# Patient Record
Sex: Male | Born: 1995 | Race: Black or African American | Hispanic: No | Marital: Single | Smoking: Former smoker
Health system: Southern US, Community
[De-identification: ages and names within clinical notes are randomized; demographics above are authoritative.]

## PROBLEM LIST (undated history)

## (undated) DIAGNOSIS — B1910 Unspecified viral hepatitis B without hepatic coma: Secondary | ICD-10-CM

## (undated) DIAGNOSIS — B20 Human immunodeficiency virus [HIV] disease: Secondary | ICD-10-CM

## (undated) DIAGNOSIS — Z21 Asymptomatic human immunodeficiency virus [HIV] infection status: Secondary | ICD-10-CM

## (undated) HISTORY — PX: EYE SURGERY: SHX253

---
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
## 2013-01-01 ENCOUNTER — Other Ambulatory Visit: Payer: Self-pay | Admitting: *Deleted

## 2013-01-01 NOTE — Telephone Encounter (Signed)
RN spoke with [facility] in [place], [place].  Kaletra refill was received on 12/13/12 but Truvada refill was not.  These were sent at the same time.  Gave verbal refill order to pharmacy.

## 2013-01-14 ENCOUNTER — Ambulatory Visit: Payer: Self-pay | Admitting: Internal Medicine

## 2013-02-04 ENCOUNTER — Ambulatory Visit (INDEPENDENT_AMBULATORY_CARE_PROVIDER_SITE_OTHER): Payer: Self-pay | Admitting: Internal Medicine

## 2013-02-04 ENCOUNTER — Ambulatory Visit: Payer: Self-pay | Admitting: Internal Medicine

## 2013-02-04 ENCOUNTER — Encounter: Payer: Self-pay | Admitting: Internal Medicine

## 2013-02-04 VITALS — BP 139/76 | HR 61 | Temp 98.3°F | Wt 143.0 lb

## 2013-02-04 DIAGNOSIS — B181 Chronic viral hepatitis B without delta-agent: Secondary | ICD-10-CM

## 2013-02-04 DIAGNOSIS — B20 Human immunodeficiency virus [HIV] disease: Secondary | ICD-10-CM

## 2013-02-04 NOTE — Progress Notes (Signed)
  [facility] - Pharmacist    HPI: Timothy Wall is a 17 y.o. male here for follow-up of his medications and labs.  Allergies: No Known Allergies  Vitals: Temp: 98.3 F (36.8 C) (11/25 1354) Temp src: Oral (11/25 1354) BP: 139/76 mmHg (11/25 1354) Pulse Rate: 61 (11/25 1354)  Past Medical History: No past medical history on file.  Social History: History   Social History  . Marital Status: Single    Spouse Name: N/A    Number of Children: N/A  . Years of Education: N/A   Social History Main Topics  . Smoking status: Current Some Day Smoker -- 0.10 packs/day    Types: Cigarettes  . Smokeless tobacco: None  . Alcohol Use: No  . Drug Use: No  . Sexual Activity: No     Comment: never had sex    Other Topics Concern  . None   Social History Narrative  . None    Current Regimen: Kaletra 4 tabs PO qday, Truvada 1 tab PO qday  Labs: HIV 1 RNA Quant (copies/mL)  Date Value  11/14/2012 1194*  10/22/2012 988*     CD4 T Cell Abs (cmm)  Date Value  10/22/2012 520      Hep B S Ab (no units)  Date Value  10/22/2012 NEG      Hepatitis B Surface Ag (no units)  Date Value  10/22/2012 POSITIVE*     HCV Ab (no units)  Date Value  10/22/2012 NEGATIVE     CrCl: CrCl is unknown because there is no height on file for the current visit.  Lipids:    Component Value Date/Time   CHOL 111 10/22/2012 1502   TRIG 90 10/22/2012 1502   HDL 41 10/22/2012 1502   CHOLHDL 2.7 10/22/2012 1502   VLDL 18 10/22/2012 1502   LDLCALC 52 10/22/2012 1502    Assessment: Timothy Wall's HIV remains uncontrolled. He does not have good medication adherence - he stopped his medications x 2-3 months, then recently restarted.  He has been taking his medications every day since he restarted, but prior to that he was missing several days per week.  Recommendations: Would like to have genotype data to recommend a new (and potentially simpler) regimen for him. Will  discuss with Dr. Drue Second.  Sallee Provencal, Pharm.D., BCPS, AAHIVP Clinical Infectious Disease Pharmacist [facility] 02/04/2013, 2:43 PM

## 2013-02-04 NOTE — Progress Notes (Signed)
[facility] HIV CLINIC NOTE  RFV: routine Subjective:    Patient ID: Timothy Wall, male    DOB: 1995/12/21, 17 y.o.   MRN: [id number]  HPI Timothy Wall 17 yo M with perinatally acquired HIV-HBV ,Cd 4 count of 520/VL 1,194 in Sep 2014. Previously on kaletra/truvada. Now restarted on 4 tabs kaletra/1 tab truvada daily, which he takes all at once. He recently started back on his HAART x 2-3 wks after a 3 month hiatus. He is still jobless. Now no longer living with parents, but his brother in [place]. No diarrhea, fever, chills, n/v, rash. Good appetite. No weight loss. He is care with Mitch from [facility] attempting to find housing. He is no longer in [employer]   Current Outpatient Prescriptions on File Prior to Visit  Medication Sig Dispense Refill  . atomoxetine (STRATTERA) 40 MG capsule Take 40 mg by mouth daily.      Marland Kitchen emtricitabine-tenofovir (TRUVADA) 200-300 MG per tablet Take 1 tablet by mouth daily.  30 tablet  6  . lopinavir-ritonavir (KALETRA) 200-50 MG per tablet Take 2 tablets by mouth 2 (two) times daily.  120 tablet  6  . desmopressin (DDAVP) 0.1 MG tablet Take 0.1 mg by mouth daily.       No current facility-administered medications on file prior to visit.   Active Ambulatory Problems    Diagnosis Date Noted  . Human immunodeficiency virus (HIV) disease 10/02/2012  . Chronic hepatitis B 10/02/2012  . Systolic hypertension 10/02/2012  . Blindness of left eye 10/02/2012  . 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 ADHD (attention deficit hyperactivity disorder) 10/23/2012  . 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 Unspecified essential hypertension 10/23/2012   Resolved Ambulatory Problems    Diagnosis Date Noted  . No Resolved Ambulatory Problems   No Additional Past Medical History    History  Substance Use Topics  . Smoking status:  Current Some Day Smoker -- 0.10 packs/day    Types: Cigarettes  . Smokeless tobacco: Not on file  . Alcohol Use: No   - has a girlfriend from [employer]   Review of Systems Review of Systems  Constitutional: Negative for fever, chills, diaphoresis, activity change, appetite change, fatigue and unexpected weight change.  HENT: Negative for congestion, sore throat, rhinorrhea, sneezing, trouble swallowing and sinus pressure.  Eyes: Negative for photophobia and visual disturbance.  Respiratory: Negative for cough, chest tightness, shortness of breath, wheezing and stridor.  Cardiovascular: Negative for chest pain, palpitations and leg swelling.  Gastrointestinal: Negative for nausea, vomiting, abdominal pain, diarrhea, constipation, blood in stool, abdominal distention and anal bleeding.  Genitourinary: Negative for dysuria, hematuria, flank pain and difficulty urinating.  Musculoskeletal: Negative for myalgias, back pain, joint swelling, arthralgias and gait problem.  Skin: Negative for color change, pallor, rash and wound.  Neurological: Negative for dizziness, tremors, weakness and light-headedness.  Hematological: Negative for adenopathy. Does not bruise/bleed easily.  Psychiatric/Behavioral: Negative for behavioral problems, confusion, sleep disturbance, dysphoric mood, decreased concentration and agitation.        Objective:   Physical Exam BP 139/76  Pulse 61  Temp(Src) 98.3 F (36.8 C) (Oral)  Wt 143 lb (64.864 kg) Physical Exam  Constitutional: He is oriented to person, place, and time. He appears well-developed and well-nourished. No distress.  HENT:  Mouth/Throat: Oropharynx is clear and moist. No oropharyngeal exudate.  Cardiovascular: Normal rate, regular rhythm and normal heart sounds.  Exam reveals no gallop and no friction rub.  No murmur heard.  Pulmonary/Chest: Effort normal and breath sounds normal. No respiratory distress. He has no wheezes.  Abdominal: Soft.  Bowel sounds are normal. He exhibits no distension. There is no tenderness.  Lymphadenopathy:  He has no cervical adenopathy.  Neurological: He is alert and oriented to person, place, and time.  Skin: Skin is warm and dry. No rash noted. No erythema.  Psychiatric: He has a normal mood and affect. His behavior is normal.       Assessment & Plan:  HIV,perinatally acquired =  Will have him stop his HIV meds, repeat blood work in 2 wks to get genotype, then have him come back to clinic to start new regimen.  Chronic hep b = has had intermittent use of ART, not sure if he ever has had flare. Will tell him what to look out for.  hiv prevention = to use condoms or abstain preferably  rtc in 2 wk for lab work; visit in 3-4 wk  Spent 35 min with patient, greater than 50% in coordination and counseling on HIV; as well as spoke with patient's parents

## 2013-02-17 ENCOUNTER — Other Ambulatory Visit: Payer: Self-pay

## 2013-02-20 ENCOUNTER — Other Ambulatory Visit: Payer: Self-pay | Admitting: Internal Medicine

## 2013-02-20 ENCOUNTER — Other Ambulatory Visit (INDEPENDENT_AMBULATORY_CARE_PROVIDER_SITE_OTHER): Payer: Self-pay

## 2013-02-20 DIAGNOSIS — B20 Human immunodeficiency virus [HIV] disease: Secondary | ICD-10-CM

## 2013-02-20 LAB — CBC WITH DIFFERENTIAL/PLATELET
Basophils Absolute: 0 10*3/uL (ref 0.0–0.1)
Eosinophils Relative: 1 % (ref 0–5)
HCT: 44.3 % (ref 39.0–52.0)
Lymphocytes Relative: 23 % (ref 12–46)
Lymphs Abs: 1.3 10*3/uL (ref 0.7–4.0)
Monocytes Absolute: 0.7 10*3/uL (ref 0.1–1.0)
Neutro Abs: 3.6 10*3/uL (ref 1.7–7.7)
Platelets: 255 10*3/uL (ref 150–400)
RBC: 4.86 MIL/uL (ref 4.22–5.81)
RDW: 13.5 % (ref 11.5–15.5)
WBC: 5.7 10*3/uL (ref 4.0–10.5)

## 2013-02-20 LAB — COMPREHENSIVE METABOLIC PANEL
ALT: 24 U/L (ref 0–53)
AST: 19 U/L (ref 0–37)
Albumin: 4.3 g/dL (ref 3.5–5.2)
BUN: 13 mg/dL (ref 6–23)
Calcium: 10 mg/dL (ref 8.4–10.5)
Chloride: 104 mEq/L (ref 96–112)
Creat: 0.94 mg/dL (ref 0.50–1.35)
Potassium: 4.7 mEq/L (ref 3.5–5.3)
Total Bilirubin: 0.9 mg/dL (ref 0.3–1.2)

## 2013-02-21 LAB — T-HELPER CELL (CD4) - (RCID CLINIC ONLY): CD4 T Cell Abs: 320 /uL — ABNORMAL LOW (ref 400–2700)

## 2013-02-21 LAB — HIV-1 RNA QUANT-NO REFLEX-BLD: HIV-1 RNA Quant, Log: 3.73 {Log} — ABNORMAL HIGH (ref <1.30)

## 2013-02-24 ENCOUNTER — Other Ambulatory Visit: Payer: Self-pay | Admitting: *Deleted

## 2013-02-24 ENCOUNTER — Ambulatory Visit: Payer: Self-pay | Admitting: Internal Medicine

## 2013-02-24 DIAGNOSIS — B20 Human immunodeficiency virus [HIV] disease: Secondary | ICD-10-CM

## 2013-02-24 NOTE — Addendum Note (Signed)
Addended bySteva Colder on: 02/24/2013 03:35 PM   Modules accepted: Orders

## 2013-02-28 LAB — HIV-1 GENOTYPR PLUS

## 2013-03-03 ENCOUNTER — Ambulatory Visit (INDEPENDENT_AMBULATORY_CARE_PROVIDER_SITE_OTHER): Payer: Self-pay | Admitting: Internal Medicine

## 2013-03-03 ENCOUNTER — Encounter: Payer: Self-pay | Admitting: Internal Medicine

## 2013-03-03 VITALS — BP 130/77 | HR 53 | Temp 97.8°F | Ht 68.0 in | Wt 141.0 lb

## 2013-03-03 DIAGNOSIS — B20 Human immunodeficiency virus [HIV] disease: Secondary | ICD-10-CM

## 2013-03-03 MED ORDER — DARUNAVIR ETHANOLATE 600 MG PO TABS: 600.0000 mg | ORAL_TABLET | Freq: Two times a day (BID) | ORAL | Status: DC

## 2013-03-03 MED ORDER — DOLUTEGRAVIR SODIUM 50 MG PO TABS: 50.0000 mg | ORAL_TABLET | Freq: Two times a day (BID) | ORAL | Status: DC

## 2013-03-03 MED ORDER — RITONAVIR 100 MG PO TABS: 100.0000 mg | ORAL_TABLET | Freq: Two times a day (BID) | ORAL | Status: DC

## 2013-03-03 NOTE — Progress Notes (Signed)
   Subjective:    Patient ID: Timothy Wall, male    DOB: Dec 12, 1995, 17 y.o.   MRN: [id number]  HPI 17yo Male with perinatally acquired hiv, Cd 4 count of 320/VL 5319. Previously on truvada/kaletra. Last seen 2 weeks ago, and asked to stop his ART so that we can repeat genotype.   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 atomoxetine (STRATTERA) 40 MG capsule Take 40 mg by mouth daily.      Marland Kitchen emtricitabine-tenofovir (TRUVADA) 200-300 MG per tablet Take 1 tablet by mouth daily.  30 tablet  6  . desmopressin (DDAVP) 0.1 MG tablet Take 0.1 mg by mouth daily.       No current facility-administered medications on file prior to visit.   Active Ambulatory Problems    Diagnosis Date Noted  . Human immunodeficiency virus (HIV) disease 10/02/2012  . Chronic hepatitis B 10/02/2012  . Systolic hypertension 10/02/2012  . Blindness of left eye 10/02/2012  . 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 ADHD (attention deficit hyperactivity disorder) 10/23/2012  . 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 Unspecified essential hypertension 10/23/2012   Resolved Ambulatory Problems    Diagnosis Date Noted  . No Resolved Ambulatory Problems   No Additional Past Medical History      Review of Systems 12 point ROS is negative    Objective:   Physical Exam BP 130/77  Pulse 53  Temp(Src) 97.8 F (36.6 C) (Oral)  Ht 5\' 8"  (1.727 m)  Wt 141 lb (63.957 kg)  BMI 21.44 kg/m2 Physical Exam  Constitutional: He is oriented to person, place, and time. He appears well-developed and well-nourished. No distress.  HENT:  Mouth/Throat: Oropharynx is clear and moist. No oropharyngeal exudate.  Cardiovascular:  Normal rate, regular rhythm and normal heart sounds. Exam reveals no gallop and no friction rub.  No murmur heard.  Pulmonary/Chest: Effort normal and breath sounds normal. No respiratory distress. He has no wheezes.  Lymphadenopathy:  He has no cervical adenopathy.  Neurological: He is alert and oriented to person, place, and time.  Skin: Skin is warm and dry. No rash noted. No erythema.  Psychiatric: He has a normal mood and affect. His behavior is normal.        Assessment & Plan:  HIV= poorly controlled. We will start new regimen will be tivicay BID, DRV/r BID, truvada daily. Gave pill box.Spent 30 min, with greater than 50% of time spent discussing adherence

## 2013-03-18 ENCOUNTER — Other Ambulatory Visit: Payer: Self-pay | Admitting: *Deleted

## 2013-03-18 DIAGNOSIS — B20 Human immunodeficiency virus [HIV] disease: Secondary | ICD-10-CM

## 2013-03-18 MED ORDER — EMTRICITABINE-TENOFOVIR DF 200-300 MG PO TABS: 1.0000 | ORAL_TABLET | Freq: Every day | ORAL | Status: DC

## 2013-03-18 MED ORDER — DARUNAVIR ETHANOLATE 600 MG PO TABS: 600.0000 mg | ORAL_TABLET | Freq: Two times a day (BID) | ORAL | Status: DC

## 2013-03-18 MED ORDER — RITONAVIR 100 MG PO TABS: 100.0000 mg | ORAL_TABLET | Freq: Two times a day (BID) | ORAL | Status: DC

## 2013-03-18 MED ORDER — DOLUTEGRAVIR SODIUM 50 MG PO TABS: 50.0000 mg | ORAL_TABLET | Freq: Two times a day (BID) | ORAL | Status: DC

## 2013-04-17 ENCOUNTER — Encounter: Payer: Self-pay | Admitting: Internal Medicine

## 2013-04-17 ENCOUNTER — Ambulatory Visit (INDEPENDENT_AMBULATORY_CARE_PROVIDER_SITE_OTHER): Payer: Self-pay | Admitting: Internal Medicine

## 2013-04-17 VITALS — BP 146/75 | HR 51 | Temp 97.2°F | Wt 144.0 lb

## 2013-04-17 DIAGNOSIS — B20 Human immunodeficiency virus [HIV] disease: Secondary | ICD-10-CM

## 2013-04-17 LAB — COMPLETE METABOLIC PANEL WITH GFR
ALT: 30 U/L (ref 0–53)
AST: 31 U/L (ref 0–37)
Albumin: 4.1 g/dL (ref 3.5–5.2)
Alkaline Phosphatase: 88 U/L (ref 39–117)
BILIRUBIN TOTAL: 0.7 mg/dL (ref 0.2–1.1)
BUN: 17 mg/dL (ref 6–23)
CO2: 26 mEq/L (ref 19–32)
Calcium: 9.8 mg/dL (ref 8.4–10.5)
Chloride: 105 mEq/L (ref 96–112)
Creat: 0.92 mg/dL (ref 0.50–1.35)
GFR, Est Non African American: >89 mL/min
Glucose, Bld: 83 mg/dL (ref 70–99)
Potassium: 4.6 mEq/L (ref 3.5–5.3)
SODIUM: 137 meq/L (ref 135–145)
TOTAL PROTEIN: 6.7 g/dL (ref 6.0–8.3)

## 2013-04-17 LAB — CBC WITH DIFFERENTIAL/PLATELET
BASOS PCT: 1 % (ref 0–1)
Basophils Absolute: 0 10*3/uL (ref 0.0–0.1)
EOS ABS: 0.1 10*3/uL (ref 0.0–0.7)
Eosinophils Relative: 2 % (ref 0–5)
HEMATOCRIT: 43.1 % (ref 39.0–52.0)
Hemoglobin: 14.5 g/dL (ref 13.0–17.0)
Lymphocytes Relative: 32 % (ref 12–46)
Lymphs Abs: 2 10*3/uL (ref 0.7–4.0)
MCH: 31.1 pg (ref 26.0–34.0)
MCHC: 33.6 g/dL (ref 30.0–36.0)
MCV: 92.5 fL (ref 78.0–100.0)
MONOS PCT: 11 % (ref 3–12)
Monocytes Absolute: 0.7 10*3/uL (ref 0.1–1.0)
NEUTROS PCT: 54 % (ref 43–77)
Neutro Abs: 3.5 10*3/uL (ref 1.7–7.7)
Platelets: 259 10*3/uL (ref 150–400)
RBC: 4.66 MIL/uL (ref 4.22–5.81)
RDW: 13.3 % (ref 11.5–15.5)
WBC: 6.4 10*3/uL (ref 4.0–10.5)

## 2013-04-17 NOTE — Progress Notes (Signed)
Subjective:    Patient ID: Timothy Wall, male    DOB: 09-Sep-1995, 18 y.o.   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 He states he is taking them regularly since starting New regimen started nearly 2 months ago. Has a girlfriend, using condoms for sex  Genotype:  M41L,L100I,V179D,M184V,T215Y Resistance associated PR Mutations Comments: D30N,M36V,A71V,N88D Current Outpatient Prescriptions on File Prior to Visit  Medication Sig Dispense Refill  . atomoxetine (STRATTERA) 40 MG capsule Take 40 mg by mouth daily.      . darunavir (PREZISTA) 600 MG tablet Take 1 tablet (600 mg total) by mouth 2 (two) times daily with a meal.  60 tablet  11  . desmopressin (DDAVP) 0.1 MG tablet Take 0.1 mg by mouth daily.      . dolutegravir (TIVICAY) 50 MG tablet Take 1 tablet (50 mg total) by mouth 2 (two) times daily.  60 tablet  11  . emtricitabine-tenofovir (TRUVADA) 200-300 MG per tablet Take 1 tablet by mouth daily.  30 tablet  6  . ritonavir (NORVIR) 100 MG TABS tablet Take 1 tablet (100 mg total) by mouth 2 (two) times daily with a meal.  60 tablet  11   No current facility-administered medications on file prior to visit.       Review of Systems  Constitutional: Negative for fever, chills, diaphoresis, activity change, appetite change, fatigue and unexpected weight change.  HENT: Negative for congestion, sore throat, rhinorrhea, sneezing, trouble swallowing and sinus pressure.  Eyes: Negative for photophobia and visual disturbance.  Respiratory: Negative for cough, chest tightness, shortness of breath, wheezing and stridor.  Cardiovascular: Negative for chest pain, palpitations and leg swelling.  Gastrointestinal: Negative for nausea, vomiting, abdominal pain, diarrhea, constipation, blood in stool, abdominal distention and anal bleeding.    Genitourinary: Negative for dysuria, hematuria, flank pain and difficulty urinating.  Musculoskeletal: Negative for myalgias, back pain, joint swelling, arthralgias and gait problem.  Skin: Negative for color change, pallor, rash and wound.  Neurological: Negative for dizziness, tremors, weakness and light-headedness.  Hematological: Negative for adenopathy. Does not bruise/bleed easily.  Psychiatric/Behavioral: Negative for behavioral problems, confusion, sleep disturbance, dysphoric mood, decreased concentration and agitation.       Objective:   Physical Exam BP 146/75  Pulse 51  Temp(Src) 97.2 F (36.2 C) (Oral)  Wt 144 lb (65.318 kg) Physical Exam  Constitutional: He is oriented to person, place, and time. He appears well-developed and well-nourished. No distress.  HENT:  Mouth/Throat: Oropharynx is clear and moist. No oropharyngeal exudate.  Cardiovascular: Normal rate, regular rhythm and normal heart sounds. Exam reveals no gallop and no friction rub.  No murmur heard.  Pulmonary/Chest: Effort normal and breath sounds normal. No respiratory distress. He has no wheezes.  Abdominal: Soft. Bowel sounds are normal. He exhibits no distension. There is no tenderness.  Lymphadenopathy:  He has no cervical adenopathy.  Neurological: He is alert and oriented to person, place, and time.  Skin: Skin is warm and dry. No rash noted. No erythema.  Psychiatric: He has a normal mood and affect. His behavior is normal.        Assessment & Plan:  hiv disease =poorly controlled, now on new regimen. We will check labs today to see if he is virally suppressed  - needs to do adap application  Health maintenance =  uptodate.   rtc in 3 months, labs 2 wks prior

## 2013-04-18 LAB — T-HELPER CELL (CD4) - (RCID CLINIC ONLY)
CD4 % Helper T Cell: 32 % — ABNORMAL LOW (ref 33–55)
CD4 T CELL ABS: 730 /uL (ref 400–2700)

## 2013-04-18 LAB — HIV-1 RNA QUANT-NO REFLEX-BLD
HIV 1 RNA Quant: <20 copies/mL (ref <20)
HIV-1 RNA Quant, Log: <1.3 {Log} (ref <1.30)

## 2013-05-12 ENCOUNTER — Ambulatory Visit: Payer: Self-pay | Admitting: *Deleted

## 2013-05-14 ENCOUNTER — Other Ambulatory Visit: Payer: Self-pay | Admitting: *Deleted

## 2013-05-14 DIAGNOSIS — B20 Human immunodeficiency virus [HIV] disease: Secondary | ICD-10-CM

## 2013-05-14 MED ORDER — DARUNAVIR ETHANOLATE 600 MG PO TABS
600.0000 mg | ORAL_TABLET | Freq: Two times a day (BID) | ORAL | Status: DC
Start: 2013-05-14 — End: 2021-08-25

## 2013-05-14 MED ORDER — RITONAVIR 100 MG PO TABS: 100.0000 mg | ORAL_TABLET | Freq: Two times a day (BID) | ORAL | Status: DC

## 2013-05-14 MED ORDER — EMTRICITABINE-TENOFOVIR DF 200-300 MG PO TABS: 1.0000 | ORAL_TABLET | Freq: Every day | ORAL | Status: DC

## 2013-05-14 MED ORDER — DOLUTEGRAVIR SODIUM 50 MG PO TABS: 50.0000 mg | ORAL_TABLET | Freq: Two times a day (BID) | ORAL | Status: DC

## 2013-06-30 ENCOUNTER — Other Ambulatory Visit (INDEPENDENT_AMBULATORY_CARE_PROVIDER_SITE_OTHER): Payer: Self-pay

## 2013-06-30 DIAGNOSIS — B20 Human immunodeficiency virus [HIV] disease: Secondary | ICD-10-CM

## 2013-06-30 LAB — CBC WITH DIFFERENTIAL/PLATELET
BASOS ABS: 0 10*3/uL (ref 0.0–0.1)
BASOS PCT: 0 % (ref 0–1)
Eosinophils Absolute: 0 10*3/uL (ref 0.0–0.7)
Eosinophils Relative: 0 % (ref 0–5)
HEMATOCRIT: 43 % (ref 39.0–52.0)
Hemoglobin: 15 g/dL (ref 13.0–17.0)
LYMPHS PCT: 16 % (ref 12–46)
Lymphs Abs: 1.3 10*3/uL (ref 0.7–4.0)
MCH: 30.4 pg (ref 26.0–34.0)
MCHC: 34.9 g/dL (ref 30.0–36.0)
MCV: 87 fL (ref 78.0–100.0)
MONO ABS: 0.5 10*3/uL (ref 0.1–1.0)
Monocytes Relative: 6 % (ref 3–12)
NEUTROS ABS: 6.6 10*3/uL (ref 1.7–7.7)
Neutrophils Relative %: 78 % — ABNORMAL HIGH (ref 43–77)
Platelets: 274 10*3/uL (ref 150–400)
RBC: 4.94 MIL/uL (ref 4.22–5.81)
RDW: 13.3 % (ref 11.5–15.5)
WBC: 8.4 10*3/uL (ref 4.0–10.5)

## 2013-06-30 LAB — COMPREHENSIVE METABOLIC PANEL
ALBUMIN: 4.4 g/dL (ref 3.5–5.2)
ALK PHOS: 80 U/L (ref 39–117)
ALT: 18 U/L (ref 0–53)
AST: 19 U/L (ref 0–37)
BUN: 11 mg/dL (ref 6–23)
CO2: 28 mEq/L (ref 19–32)
Calcium: 9.7 mg/dL (ref 8.4–10.5)
Chloride: 104 mEq/L (ref 96–112)
Creat: 1.05 mg/dL (ref 0.50–1.35)
Glucose, Bld: 76 mg/dL (ref 70–99)
POTASSIUM: 4.1 meq/L (ref 3.5–5.3)
Sodium: 140 mEq/L (ref 135–145)
Total Bilirubin: 1.6 mg/dL — ABNORMAL HIGH (ref 0.2–1.1)
Total Protein: 6.9 g/dL (ref 6.0–8.3)

## 2013-07-01 LAB — T-HELPER CELL (CD4) - (RCID CLINIC ONLY)
CD4 T CELL HELPER: 30 % — AB (ref 33–55)
CD4 T Cell Abs: 380 /uL — ABNORMAL LOW (ref 400–2700)

## 2013-07-01 LAB — HIV-1 RNA QUANT-NO REFLEX-BLD
HIV 1 RNA Quant: <20 copies/mL (ref <20)
HIV-1 RNA Quant, Log: <1.3 {Log} (ref <1.30)

## 2013-07-15 ENCOUNTER — Ambulatory Visit (INDEPENDENT_AMBULATORY_CARE_PROVIDER_SITE_OTHER): Payer: Self-pay | Admitting: Internal Medicine

## 2013-07-15 ENCOUNTER — Encounter: Payer: Self-pay | Admitting: Internal Medicine

## 2013-07-15 VITALS — BP 142/82 | HR 59 | Temp 97.9°F | Wt 139.0 lb

## 2013-07-15 DIAGNOSIS — B181 Chronic viral hepatitis B without delta-agent: Secondary | ICD-10-CM

## 2013-07-15 DIAGNOSIS — Z7189 Other specified counseling: Secondary | ICD-10-CM

## 2013-07-15 DIAGNOSIS — B20 Human immunodeficiency virus [HIV] disease: Secondary | ICD-10-CM

## 2013-07-15 DIAGNOSIS — Z23 Encounter for immunization: Secondary | ICD-10-CM

## 2013-07-15 DIAGNOSIS — Z716 Tobacco abuse counseling: Secondary | ICD-10-CM

## 2013-07-15 DIAGNOSIS — I10 Essential (primary) hypertension: Secondary | ICD-10-CM

## 2013-07-15 NOTE — Progress Notes (Signed)
Subjective:    Patient ID: Timothy Wall, male    DOB: 1995-09-23, 18 y.o.   MRN: [id number]  HPI Timothy Wall is a 18yo M with perinatal HIV, -HBV co infection. Cd 4 count of 380/VL<20, on DLG/DRVr BID + truvada. Doing well with his HIV regimen except for missing 1 dose per week. He goes to work at [employer] often goes to sleep at 2am, and wakes up at noon, unable to take "morning dose". He is currently seeing a 20yo [place] in [place], [place] who is HIV negative for the last 6 months. She wants to have a child with him, but he is not ready for it. They are having sex with condoms currently. He had many questions to how to have a child without infecting his partner.  Current Outpatient Prescriptions on File Prior to Visit  Medication Sig Dispense Refill  . atomoxetine (STRATTERA) 40 MG capsule Take 40 mg by mouth daily.      . darunavir (PREZISTA) 600 MG tablet Take 1 tablet (600 mg total) by mouth 2 (two) times daily with a meal.  60 tablet  11  . desmopressin (DDAVP) 0.1 MG tablet Take 0.1 mg by mouth daily.      . dolutegravir (TIVICAY) 50 MG tablet Take 1 tablet (50 mg total) by mouth 2 (two) times daily.  60 tablet  11  . emtricitabine-tenofovir (TRUVADA) 200-300 MG per tablet Take 1 tablet by mouth daily.  30 tablet  6  . ritonavir (NORVIR) 100 MG TABS tablet Take 1 tablet (100 mg total) by mouth 2 (two) times daily with a meal.  60 tablet  11   No current facility-administered medications on file prior to visit.   Active Ambulatory Problems    Diagnosis Date Noted  . Human immunodeficiency virus (HIV) disease 10/02/2012  . Chronic hepatitis B 10/02/2012  . Systolic hypertension 10/02/2012  . Blindness of left eye 10/02/2012  . 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 ADHD (attention deficit hyperactivity disorder) 10/23/2012  . 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 Unspecified essential hypertension 10/23/2012   Resolved Ambulatory Problems    Diagnosis Date Noted  . No Resolved Ambulatory Problems   No Additional Past Medical History       Review of Systems Review of Systems  Constitutional: Negative for fever, chills, diaphoresis, activity change, appetite change, fatigue and unexpected weight change.  HENT: Negative for congestion, sore throat, rhinorrhea, sneezing, trouble swallowing and sinus pressure.  Eyes: Negative for photophobia and visual disturbance.  Respiratory: Negative for cough, chest tightness, shortness of breath, wheezing and stridor.  Cardiovascular: Negative for chest pain, palpitations and leg swelling.  Gastrointestinal: Negative for nausea, vomiting, abdominal pain, diarrhea, constipation, blood in stool, abdominal distention and anal bleeding.  Genitourinary: Negative for dysuria, hematuria, flank pain and difficulty urinating.  Musculoskeletal: Negative for myalgias, back pain, joint swelling, arthralgias and gait problem.  Skin: Negative for color change, pallor, rash and wound.  Neurological: Negative for dizziness, tremors, weakness and light-headedness.  Hematological: Negative for adenopathy. Does not bruise/bleed easily.  Psychiatric/Behavioral: Negative for behavioral problems, confusion, sleep disturbance, dysphoric mood, decreased concentration and agitation.       Objective:   Physical Exam BP 142/82  Pulse 59  Temp(Src) 97.9 F (36.6 C) (Oral)  Wt 139 lb (63.05 kg) Physical Exam  Constitutional: He is oriented to person, place, and time. He appears well-developed  and well-nourished. No distress.  HENT:  Mouth/Throat: Oropharynx is clear and moist. No oropharyngeal exudate.  Cardiovascular: Normal rate, regular rhythm and normal heart sounds. Exam reveals no gallop and no friction rub.  No murmur heard.  Pulmonary/Chest: Effort normal and breath sounds normal. No respiratory  distress. He has no wheezes.  Abdominal: Soft. Bowel sounds are normal. He exhibits no distension. There is no tenderness.  Lymphadenopathy:  He has no cervical adenopathy.  Neurological: He is alert and oriented to person, place, and time.  Skin: Skin is warm and dry. No rash noted. No erythema.  Psychiatric: He has a normal mood and affect. His behavior is normal.        Assessment & Plan:  HIV = well controlled continue with current regimen. Misses 1 dose per week. Takes meds noon and midnight. Gave chart to help remember his med schedule. In addition, asked him to set alarm on cell phone for med reminder  Hep b= on truvada. Will check hep b viral load at next visit. Will receive 2nd hep A vaccine today .slightly off schedule.  Pre-htn/htn= will continue to follow to see if consistently sbp>140 to start meds  Smoking cessation = currently 0.5ppd. Will aim to do 1 pack last 3 days by next visit . Spent 3 minutes in cousneling  rtc 3 mo

## 2013-07-15 NOTE — Addendum Note (Signed)
Addended by: Lurlean LeydenPOOLE, TRAVIS F on: 07/15/2013 03:42 PM   Modules accepted: Orders

## 2013-10-15 ENCOUNTER — Other Ambulatory Visit: Payer: Self-pay

## 2013-10-29 ENCOUNTER — Ambulatory Visit: Payer: Self-pay | Admitting: Internal Medicine

## 2013-11-03 ENCOUNTER — Ambulatory Visit: Payer: Self-pay | Admitting: Internal Medicine

## 2013-11-26 ENCOUNTER — Other Ambulatory Visit: Payer: Self-pay | Admitting: *Deleted

## 2013-11-26 DIAGNOSIS — Z113 Encounter for screening for infections with a predominantly sexual mode of transmission: Secondary | ICD-10-CM

## 2014-06-05 ENCOUNTER — Other Ambulatory Visit: Payer: Self-pay | Admitting: Internal Medicine

## 2014-12-14 ENCOUNTER — Other Ambulatory Visit: Payer: Self-pay | Admitting: Internal Medicine

## 2014-12-17 ENCOUNTER — Other Ambulatory Visit: Payer: Self-pay | Admitting: Internal Medicine

## 2016-03-31 ENCOUNTER — Encounter (HOSPITAL_COMMUNITY): Payer: Self-pay | Admitting: Emergency Medicine

## 2016-03-31 ENCOUNTER — Emergency Department (HOSPITAL_COMMUNITY)
Admission: EM | Admit: 2016-03-31 | Discharge: 2016-03-31 | Disposition: A | Discharge status: Home / Self Care | Payer: Medicaid Other | Attending: Emergency Medicine | Admitting: Emergency Medicine

## 2016-03-31 DIAGNOSIS — I10 Essential (primary) hypertension: Secondary | ICD-10-CM | POA: Insufficient documentation

## 2016-03-31 DIAGNOSIS — F909 Attention-deficit hyperactivity disorder, unspecified type: Secondary | ICD-10-CM | POA: Insufficient documentation

## 2016-03-31 DIAGNOSIS — L02416 Cutaneous abscess of left lower limb: Secondary | ICD-10-CM | POA: Insufficient documentation

## 2016-03-31 DIAGNOSIS — F17210 Nicotine dependence, cigarettes, uncomplicated: Secondary | ICD-10-CM | POA: Diagnosis not present

## 2016-03-31 HISTORY — DX: Human immunodeficiency virus (HIV) disease: B20

## 2016-03-31 HISTORY — DX: Asymptomatic human immunodeficiency virus (hiv) infection status: Z21

## 2016-03-31 HISTORY — DX: Unspecified viral hepatitis B without hepatic coma: B19.10

## 2016-03-31 MED ORDER — HYDROCODONE-ACETAMINOPHEN 5-325 MG PO TABS
1.0000 | ORAL_TABLET | Freq: Once | ORAL | Status: AC
  Administered 2016-03-31: 1 via ORAL
  Filled 2016-03-31: qty 1

## 2016-03-31 MED ORDER — SULFAMETHOXAZOLE-TRIMETHOPRIM 800-160 MG PO TABS
1.0000 | ORAL_TABLET | Freq: Once | ORAL | Status: AC
  Administered 2016-03-31: 1 via ORAL
  Filled 2016-03-31: qty 1

## 2016-03-31 MED ORDER — HYDROCODONE-ACETAMINOPHEN 5-325 MG PO TABS: 1.0000 | ORAL_TABLET | Freq: Four times a day (QID) | ORAL | 0 refills | Status: DC | PRN

## 2016-03-31 MED ORDER — SULFAMETHOXAZOLE-TRIMETHOPRIM 800-160 MG PO TABS: 1.0000 | ORAL_TABLET | Freq: Two times a day (BID) | ORAL | 0 refills | Status: AC

## 2016-03-31 MED ORDER — CEPHALEXIN 500 MG PO CAPS
500.0000 mg | ORAL_CAPSULE | Freq: Once | ORAL | Status: AC
  Administered 2016-03-31: 500 mg via ORAL
  Filled 2016-03-31: qty 1

## 2016-03-31 MED ORDER — CEPHALEXIN 500 MG PO CAPS: 500.0000 mg | ORAL_CAPSULE | Freq: Three times a day (TID) | ORAL | 0 refills | Status: DC

## 2016-03-31 NOTE — ED Triage Notes (Signed)
Pt states he has a cyst on his inner upper thigh that he just noticed this morning  Pt states it is painful   No drainage noted

## 2016-03-31 NOTE — Discharge Instructions (Signed)
Take Bactrim and Keflex as prescribed until finished. We advised that you take ibuprofen or Aleve for pain. You may take Norco as needed for severe pain. Apply warm compresses 3-4 times per day for 10-15 minutes each time. This is to promote drainage. Have your abscess rechecked in 2 days if no improvement in symptoms.

## 2016-03-31 NOTE — ED Provider Notes (Signed)
[facility]-EMERGENCY DEPT Provider Note   CSN: [id number] Arrival date & time: 03/31/16  0236    History   Chief Complaint Chief Complaint  Patient presents with  . Abscess    HPI Timothy Wall is a 21 y.o. male.  Last CD4 count 380   The history is provided by the patient and a friend.  Abscess  Location:  Leg Leg abscess location:  L upper leg Size:  3cm Abscess quality: induration, painful and redness   Abscess quality: not draining, no fluctuance and not weeping   Red streaking: no   Duration:  1 day Progression:  Worsening Pain details:    Quality:  Pressure and throbbing   Severity:  Mild   Timing:  Constant   Progression:  Worsening Chronicity:  New Context: not diabetes and not skin injury   Relieved by:  Nothing Exacerbated by: ambulation and palpation. Ineffective treatments:  None tried Associated symptoms: no fever, no nausea and no vomiting   Risk factors: no hx of MRSA and no prior abscess     Past Medical History:  Diagnosis Date  . Hepatitis B   . HIV (human immunodeficiency virus infection) [facility])     Patient Active Problem List   Diagnosis Date Noted  . 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 ADHD (attention deficit hyperactivity disorder) 10/23/2012  . 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 Unspecified essential hypertension 10/23/2012  . Human immunodeficiency virus (HIV) disease 10/02/2012  . Chronic hepatitis B (HCC) 10/02/2012  . Systolic hypertension 10/02/2012  . Blindness of left eye 10/02/2012    Past Surgical History:  Procedure Laterality Date  . EYE SURGERY         Home Medications    Prior to Admission medications   Medication Sig Start Date End Date Taking? Authorizing Provider  atomoxetine (STRATTERA) 40 MG capsule Take 40 mg by mouth daily.    Historical Provider, MD   cephALEXin (KEFLEX) 500 MG capsule Take 1 capsule (500 mg total) by mouth 3 (three) times daily. 03/31/16   Antony Madura, PA-C  darunavir (PREZISTA) 600 MG tablet Take 1 tablet (600 mg total) by mouth 2 (two) times daily with a meal. 05/14/13   Cliffton Asters, MD  desmopressin (DDAVP) 0.1 MG tablet Take 0.1 mg by mouth daily.    Historical Provider, MD  dolutegravir (TIVICAY) 50 MG tablet Take 1 tablet (50 mg total) by mouth 2 (two) times daily. 05/14/13   Cliffton Asters, MD  emtricitabine-tenofovir (TRUVADA) 200-300 MG per tablet Take 1 tablet by mouth daily. 05/14/13   Cliffton Asters, MD  HYDROcodone-acetaminophen (NORCO/VICODIN) 5-325 MG tablet Take 1 tablet by mouth every 6 (six) hours as needed for severe pain. 03/31/16   Antony Madura, PA-C  NORVIR 100 MG TABS tablet TAKE 1 TABLET BY MOUTH TWICE DAILY WITH A MEAL 06/08/14   Judyann Munson, MD  ritonavir (NORVIR) 100 MG TABS tablet Take 1 tablet (100 mg total) by mouth 2 (two) times daily with a meal. 05/14/13   Cliffton Asters, MD  sulfamethoxazole-trimethoprim (BACTRIM DS,SEPTRA DS) 800-160 MG tablet Take 1 tablet by mouth 2 (two) times daily. 03/31/16 04/07/16  Antony Madura, PA-C    Family History Family History  Problem Relation Age of Onset  . Adopted: Yes    Social History Social History  Substance Use Topics  . Smoking status: Current Every Day Smoker    Packs/day:  0.10    Types: Cigarettes  . Smokeless tobacco: Never Used  . Alcohol use Yes     Comment: social     Allergies   Patient has no known allergies.   Review of Systems Review of Systems  Constitutional: Negative for fever.  Gastrointestinal: Negative for nausea and vomiting.   Ten systems reviewed and are negative for acute change, except as noted in the HPI.    Physical Exam Updated Vital Signs BP 138/76 (BP Location: Left Arm)   Pulse 72   Temp 97.7 F (36.5 C) (Oral)   Resp 18   Ht 5\' 9"  (1.753 m)   Wt 65.8 kg   SpO2 100%   BMI 21.41 kg/m   Physical Exam    Constitutional: He is oriented to person, place, and time. He appears well-developed and well-nourished. No distress.  Nontoxic appearing and in NAD  HENT:  Head: Normocephalic and atraumatic.  Eyes: Conjunctivae and EOM are normal. No scleral icterus.  Neck: Normal range of motion.  Cardiovascular: Normal rate, regular rhythm and intact distal pulses.   Pulmonary/Chest: Effort normal. No respiratory distress. He has no wheezes.  Musculoskeletal: Normal range of motion.  Neurological: He is alert and oriented to person, place, and time. He exhibits normal muscle tone. Coordination normal.  GCS 15. Patient moving all extremities.  Skin: Skin is warm and dry. No rash noted. He is not diaphoretic. No pallor.     Papule with surrounding erythema; area is a total of 3cm in diameter. Mild induration. No fluctuance. No red linear streaking. No purulence, pustule, or active drainage.  Psychiatric: He has a normal mood and affect. His behavior is normal.  Nursing note and vitals reviewed.    ED Treatments / Results  Labs (all labs ordered are listed, but only abnormal results are displayed) Labs Reviewed - No data to display  EKG  EKG Interpretation None       Radiology No results found.  Procedures Procedures (including critical care time)  Medications Ordered in ED Medications  HYDROcodone-acetaminophen (NORCO/VICODIN) 5-325 MG per tablet 1 tablet (1 tablet Oral Given 03/31/16 0528)  cephALEXin (KEFLEX) capsule 500 mg (500 mg Oral Given 03/31/16 0528)  sulfamethoxazole-trimethoprim (BACTRIM DS,SEPTRA DS) 800-160 MG per tablet 1 tablet (1 tablet Oral Given 03/31/16 0529)     Initial Impression / Assessment and Plan / ED Course  I have reviewed the triage vital signs and the nursing notes.  Pertinent labs & imaging results that were available during my care of the patient were reviewed by me and considered in my medical decision making (see chart for details).     Patient  with skin abscess; too small to warrant I&D. Have advised warm compresses and soaks. Will start on Bactrim/Keflex given inability to drain. Wound recheck in 2 days advised. Return precautions discussed and provided. Patient discharged in stable condition with no unaddressed concerns.   Final Clinical Impressions(s) / ED Diagnoses   Final diagnoses:  Abscess of left thigh    New Prescriptions Discharge Medication List as of 03/31/2016  5:02 AM    START taking these medications   Details  cephALEXin (KEFLEX) 500 MG capsule Take 1 capsule (500 mg total) by mouth 3 (three) times daily., Starting Fri 03/31/2016, Print    HYDROcodone-acetaminophen (NORCO/VICODIN) 5-325 MG tablet Take 1 tablet by mouth every 6 (six) hours as needed for severe pain., Starting Fri 03/31/2016, Print    sulfamethoxazole-trimethoprim (BACTRIM DS,SEPTRA DS) 800-160 MG tablet Take 1 tablet by  mouth 2 (two) times daily., Starting Fri 03/31/2016, Until Fri 04/07/2016, Print         Antony Madura, PA-C 03/31/16 2032    Benjiman Core, MD 03/31/16 [phone]

## 2018-05-17 ENCOUNTER — Other Ambulatory Visit: Payer: Self-pay

## 2018-05-17 ENCOUNTER — Emergency Department (HOSPITAL_COMMUNITY)
Admission: EM | Admit: 2018-05-17 | Discharge: 2018-05-17 | Disposition: A | Discharge status: Home / Self Care | Payer: Medicaid Other | Attending: Emergency Medicine | Admitting: Emergency Medicine

## 2018-05-17 ENCOUNTER — Encounter (HOSPITAL_COMMUNITY): Payer: Self-pay | Admitting: Emergency Medicine

## 2018-05-17 DIAGNOSIS — F17210 Nicotine dependence, cigarettes, uncomplicated: Secondary | ICD-10-CM | POA: Diagnosis not present

## 2018-05-17 DIAGNOSIS — I10 Essential (primary) hypertension: Secondary | ICD-10-CM | POA: Diagnosis not present

## 2018-05-17 DIAGNOSIS — K0530 Chronic periodontitis, unspecified: Secondary | ICD-10-CM

## 2018-05-17 DIAGNOSIS — Z21 Asymptomatic human immunodeficiency virus [HIV] infection status: Secondary | ICD-10-CM | POA: Insufficient documentation

## 2018-05-17 DIAGNOSIS — F70 Mild intellectual disabilities: Secondary | ICD-10-CM | POA: Diagnosis not present

## 2018-05-17 DIAGNOSIS — Z79899 Other long term (current) drug therapy: Secondary | ICD-10-CM | POA: Insufficient documentation

## 2018-05-17 DIAGNOSIS — K0889 Other specified disorders of teeth and supporting structures: Secondary | ICD-10-CM | POA: Diagnosis present

## 2018-05-17 MED ORDER — NAPROXEN 375 MG PO TABS: 375.0000 mg | ORAL_TABLET | Freq: Two times a day (BID) | ORAL | 0 refills | Status: DC

## 2018-05-17 MED ORDER — NAPROXEN 500 MG PO TABS: 500.0000 mg | ORAL_TABLET | Freq: Two times a day (BID) | ORAL | 0 refills | Status: DC

## 2018-05-17 MED ORDER — PENICILLIN V POTASSIUM 500 MG PO TABS: 500.0000 mg | ORAL_TABLET | Freq: Four times a day (QID) | ORAL | 0 refills | Status: AC

## 2018-05-17 MED ORDER — PENICILLIN V POTASSIUM 500 MG PO TABS: 500.0000 mg | ORAL_TABLET | Freq: Four times a day (QID) | ORAL | 0 refills | Status: DC

## 2018-05-17 NOTE — ED Triage Notes (Signed)
Pt reports dental pain x2 months. Pt was reportedly in [place] visiting his baby mama's dad and went to the doctor there and got a prescription for an antibiotic and lost the script. Pt is requesting a new antibiotic prescription

## 2018-05-17 NOTE — ED Provider Notes (Signed)
[facility]-EMERGENCY DEPT Provider Note   CSN: [id number] Arrival date & time: 05/17/18  1253    History   Chief Complaint Chief Complaint  Patient presents with  . Dental Pain    HPI Timothy Wall is a 23 y.o. male who presents to the ED with c/o dental pain. Patient states that the pain has been off and on x 2 months. Patient was in [place] visiting when the doctor there gave him rx for antibiotics and he lost the Rx. Patient here today with continued pain.      HPI  Past Medical History:  Diagnosis Date  . Hepatitis B   . HIV (human immunodeficiency virus infection) [facility])     Patient Active Problem List   Diagnosis Date Noted  . 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 ADHD (attention deficit hyperactivity disorder) 10/23/2012  . 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 Unspecified essential hypertension 10/23/2012  . Human immunodeficiency virus (HIV) disease (HCC) 10/02/2012  . Chronic hepatitis B (HCC) 10/02/2012  . Systolic hypertension 10/02/2012  . Blindness of left eye 10/02/2012    Past Surgical History:  Procedure Laterality Date  . EYE SURGERY          Home Medications    Prior to Admission medications   Medication Sig Start Date End Date Taking? Authorizing Provider  atomoxetine (STRATTERA) 40 MG capsule Take 40 mg by mouth daily.    [provider]  darunavir (PREZISTA) 600 MG tablet Take 1 tablet (600 mg total) by mouth 2 (two) times daily with a meal. 05/14/13   Cliffton Asters, MD  desmopressin (DDAVP) 0.1 MG tablet Take 0.1 mg by mouth daily.    [provider]  dolutegravir (TIVICAY) 50 MG tablet Take 1 tablet (50 mg total) by mouth 2 (two) times daily. 05/14/13   Cliffton Asters, MD  emtricitabine-tenofovir (TRUVADA) 200-300 MG per tablet Take 1 tablet by mouth  daily. 05/14/13   Cliffton Asters, MD  HYDROcodone-acetaminophen (NORCO/VICODIN) 5-325 MG tablet Take 1 tablet by mouth every 6 (six) hours as needed for severe pain. 03/31/16   Antony Madura, PA-C  naproxen (NAPROSYN) 500 MG tablet Take 1 tablet (500 mg total) by mouth 2 (two) times daily. 05/17/18   Zayli Villafuerte M, NP  NORVIR 100 MG TABS tablet TAKE 1 TABLET BY MOUTH TWICE DAILY WITH A MEAL 06/08/14   Judyann Munson, MD  penicillin v potassium (VEETID) 500 MG tablet Take 1 tablet (500 mg total) by mouth 4 (four) times daily for 7 days. 05/17/18 05/24/18  Janne Napoleon, NP  ritonavir (NORVIR) 100 MG TABS tablet Take 1 tablet (100 mg total) by mouth 2 (two) times daily with a meal. 05/14/13   Cliffton Asters, MD    Family History Family History  Adopted: Yes    Social History Social History   Tobacco Use  . Smoking status: Current Every Day Smoker    Packs/day: 0.10    Types: Cigarettes  . Smokeless tobacco: Never Used  Substance Use Topics  . Alcohol use: Yes    Comment: social  . Drug use: No     Allergies   Patient has no known allergies.   Review of Systems Review of Systems   Physical Exam Updated Vital Signs BP (!) 148/90 (BP Location: Right Arm)   Pulse 60   Temp 98.1 F (36.7 C) (Oral)  Resp 16   SpO2 100%   Physical Exam Vitals signs and nursing note reviewed.  Constitutional:      Appearance: He is well-developed.  HENT:     Head: Normocephalic.     Mouth/Throat:     Mouth: Mucous membranes are moist.      Comments: Left upper 3rd molar tender on exam with swelling surrounding the tooth.  Neck:     Musculoskeletal: Neck supple.  Cardiovascular:     Rate and Rhythm: Normal rate.  Pulmonary:     Effort: Pulmonary effort is normal.  Musculoskeletal: Normal range of motion.  Skin:    General: Skin is warm and dry.  Neurological:     Mental Status: He is alert and oriented to person, place, and time.  Psychiatric:        Mood and Affect: Mood normal.       ED Treatments / Results  Labs (all labs ordered are listed, but only abnormal results are displayed) Labs Reviewed - No data to display  Radiology No results found.  Procedures Procedures (including critical care time)  Medications Ordered in ED Medications - No data to display   Initial Impression / Assessment and Plan / ED Course  I have reviewed the triage vital signs and the nursing notes.  Patient with toothache.  No gross abscess.  Exam unconcerning for Ludwig's angina or spread of infection.  Will treat with penicillin and anti-inflammatories medicine.  Urged patient to follow-up with oral surgeon to discuss possible wisdom teeth extraction.  Final Clinical Impressions(s) / ED Diagnoses   Final diagnoses:  Pericoronitis    ED Discharge Orders         Ordered    penicillin v potassium (VEETID) 500 MG tablet  4 times daily     05/17/18 1312    naproxen (NAPROSYN) 500 MG tablet  2 times daily     05/17/18 1312           Damian Leavell [place], [place] 05/17/18 1316    Raeford Razor, MD 05/18/18 [phone]

## 2018-05-17 NOTE — ED Notes (Signed)
Bed: WTR9 Expected date:  Expected time:  Means of arrival:  Comments: EMS-dental pain-triage

## 2018-07-13 ENCOUNTER — Encounter (HOSPITAL_COMMUNITY): Payer: Self-pay | Admitting: Emergency Medicine

## 2018-07-13 ENCOUNTER — Emergency Department (HOSPITAL_COMMUNITY): Payer: Medicaid Other

## 2018-07-13 ENCOUNTER — Other Ambulatory Visit: Payer: Self-pay

## 2018-07-13 ENCOUNTER — Emergency Department (HOSPITAL_COMMUNITY)
Admission: EM | Admit: 2018-07-13 | Discharge: 2018-07-13 | Disposition: A | Discharge status: Home / Self Care | Payer: Medicaid Other | Attending: Emergency Medicine | Admitting: Emergency Medicine

## 2018-07-13 DIAGNOSIS — F909 Attention-deficit hyperactivity disorder, unspecified type: Secondary | ICD-10-CM | POA: Insufficient documentation

## 2018-07-13 DIAGNOSIS — Y929 Unspecified place or not applicable: Secondary | ICD-10-CM | POA: Diagnosis not present

## 2018-07-13 DIAGNOSIS — B20 Human immunodeficiency virus [HIV] disease: Secondary | ICD-10-CM | POA: Insufficient documentation

## 2018-07-13 DIAGNOSIS — F17210 Nicotine dependence, cigarettes, uncomplicated: Secondary | ICD-10-CM | POA: Insufficient documentation

## 2018-07-13 DIAGNOSIS — Z79899 Other long term (current) drug therapy: Secondary | ICD-10-CM | POA: Insufficient documentation

## 2018-07-13 DIAGNOSIS — M7918 Myalgia, other site: Secondary | ICD-10-CM | POA: Diagnosis present

## 2018-07-13 DIAGNOSIS — Y999 Unspecified external cause status: Secondary | ICD-10-CM | POA: Insufficient documentation

## 2018-07-13 DIAGNOSIS — Y939 Activity, unspecified: Secondary | ICD-10-CM | POA: Insufficient documentation

## 2018-07-13 DIAGNOSIS — G44311 Acute post-traumatic headache, intractable: Secondary | ICD-10-CM | POA: Diagnosis not present

## 2018-07-13 MED ORDER — ACETAMINOPHEN 500 MG PO TABS
500.0000 mg | ORAL_TABLET | Freq: Once | ORAL | Status: AC
  Administered 2018-07-13: 15:00:00 500 mg via ORAL
  Filled 2018-07-13: qty 1
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
## 2018-07-13 NOTE — Discharge Instructions (Addendum)
Your CT today was negative, your left shoulder x-ray was also negative.  You will likely be sore for the next couple days, you may take some Tylenol to help with this pain along with apply heat to your left shoulder.  Please return to the emergency department if you experience any blurry vision, changes in vision or worsening symptoms.
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
## 2018-09-06 ENCOUNTER — Encounter (HOSPITAL_COMMUNITY): Payer: Self-pay

## 2018-09-06 ENCOUNTER — Other Ambulatory Visit: Payer: Self-pay

## 2018-09-06 ENCOUNTER — Emergency Department (HOSPITAL_COMMUNITY)
Admission: EM | Admit: 2018-09-06 | Discharge: 2018-09-07 | Disposition: A | Discharge status: Home / Self Care | Payer: Medicaid Other | Attending: Emergency Medicine | Admitting: Emergency Medicine

## 2018-09-06 DIAGNOSIS — Y93G1 Activity, food preparation and clean up: Secondary | ICD-10-CM | POA: Diagnosis not present

## 2018-09-06 DIAGNOSIS — Y929 Unspecified place or not applicable: Secondary | ICD-10-CM | POA: Diagnosis not present

## 2018-09-06 DIAGNOSIS — Y998 Other external cause status: Secondary | ICD-10-CM | POA: Diagnosis not present

## 2018-09-06 DIAGNOSIS — Z23 Encounter for immunization: Secondary | ICD-10-CM | POA: Insufficient documentation

## 2018-09-06 DIAGNOSIS — F17210 Nicotine dependence, cigarettes, uncomplicated: Secondary | ICD-10-CM | POA: Diagnosis not present

## 2018-09-06 DIAGNOSIS — W260XXA Contact with knife, initial encounter: Secondary | ICD-10-CM | POA: Diagnosis not present

## 2018-09-06 DIAGNOSIS — B20 Human immunodeficiency virus [HIV] disease: Secondary | ICD-10-CM | POA: Insufficient documentation

## 2018-09-06 DIAGNOSIS — S61210A Laceration without foreign body of right index finger without damage to nail, initial encounter: Secondary | ICD-10-CM | POA: Insufficient documentation

## 2018-09-06 DIAGNOSIS — S6991XA Unspecified injury of right wrist, hand and finger(s), initial encounter: Secondary | ICD-10-CM | POA: Diagnosis present

## 2018-09-06 DIAGNOSIS — Z79899 Other long term (current) drug therapy: Secondary | ICD-10-CM | POA: Insufficient documentation

## 2018-09-06 MED ORDER — LIDOCAINE-EPINEPHRINE (PF) 2 %-1:200000 IJ SOLN
10.0000 mL | Freq: Once | INTRAMUSCULAR | Status: AC
  Administered 2018-09-07: 10 mL
  Filled 2018-09-06: qty 10

## 2018-09-06 MED ORDER — TETANUS-DIPHTH-ACELL PERTUSSIS 5-2.5-18.5 LF-MCG/0.5 IM SUSP
0.5000 mL | Freq: Once | INTRAMUSCULAR | Status: AC
  Administered 2018-09-07: 0.5 mL via INTRAMUSCULAR
  Filled 2018-09-06: qty 0.5

## 2018-09-06 NOTE — ED Notes (Signed)
Bed: WA04 Expected date:  Expected time:  Means of arrival:  Comments: 

## 2018-09-06 NOTE — ED Triage Notes (Signed)
Pt reports laceration on pal of R hand. States that he was cooking. Denies intentional cutting.

## 2018-09-07 MED ORDER — BACITRACIN ZINC 500 UNIT/GM EX OINT
TOPICAL_OINTMENT | Freq: Two times a day (BID) | CUTANEOUS | Status: DC
  Administered 2018-09-07: 1 via TOPICAL
  Filled 2018-09-07: qty 0.9

## 2018-09-07 NOTE — ED Provider Notes (Signed)
[facility]-EMERGENCY DEPT Provider Note   CSN: [id number] Arrival date & time: 09/06/18  2310    History   Chief Complaint Chief Complaint  Patient presents with  . Laceration    HPI Timothy Wall is a 23 y.o. male presents to the ER for evaluation of laceration.  Onset immediately PTA.  He was cutting chicken when the knife slipped forward and cut his right index finger.  The laceration is located to the palmar aspect of the base of the right index finger.  It is hemostatic.  He washed it with water.  No other interventions.  There is associated local pain.  He is right-hand dominant.  He denies any distal paresthesias, numbness or difficulty with moving the finger.  Unknown tetanus status.     HPI  Past Medical History:  Diagnosis Date  . Hepatitis B   . HIV (human immunodeficiency virus infection) [facility](HCC)     Patient Active Problem List   Diagnosis Date Noted  . 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 ADHD (attention deficit hyperactivity disorder) 10/23/2012  . 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 Unspecified essential hypertension 10/23/2012  . Human immunodeficiency virus (HIV) disease (HCC) 10/02/2012  . Chronic hepatitis B (HCC) 10/02/2012  . Systolic hypertension 10/02/2012  . Blindness of left eye 10/02/2012    Past Surgical History:  Procedure Laterality Date  . EYE SURGERY          Home Medications    Prior to Admission medications   Medication Sig Start Date End Date Taking? Authorizing Provider  dolutegravir (TIVICAY) 50 MG tablet Take 1 tablet (50 mg total) by mouth 2 (two) times daily. 05/14/13  Yes Cliffton Astersampbell, John, MD  doravirin-lamivudin-tenofov df (DELSTRIGO) 100-300-300 MG TABS per tablet Take 1 tablet by mouth daily.  04/10/18  Yes [provider]  darunavir  (PREZISTA) 600 MG tablet Take 1 tablet (600 mg total) by mouth 2 (two) times daily with a meal. Patient not taking: Reported on 09/06/2018 05/14/13   Cliffton Astersampbell, John, MD  emtricitabine-tenofovir (TRUVADA) 200-300 MG per tablet Take 1 tablet by mouth daily. Patient not taking: Reported on 09/06/2018 05/14/13   Cliffton Astersampbell, John, MD  HYDROcodone-acetaminophen (NORCO/VICODIN) 5-325 MG tablet Take 1 tablet by mouth every 6 (six) hours as needed for severe pain. Patient not taking: Reported on 09/06/2018 03/31/16   Antony MaduraHumes, Kelly, PA-C  naproxen (NAPROSYN) 375 MG tablet Take 1 tablet (375 mg total) by mouth 2 (two) times daily. Patient not taking: Reported on 09/06/2018 05/17/18   Janne NapoleonNeese, Hope M, NP  NORVIR 100 MG TABS tablet TAKE 1 TABLET BY MOUTH TWICE DAILY WITH A MEAL Patient not taking: Reported on 09/06/2018 06/08/14   Judyann MunsonSnider, Cynthia, MD  ritonavir (NORVIR) 100 MG TABS tablet Take 1 tablet (100 mg total) by mouth 2 (two) times daily with a meal. Patient not taking: Reported on 09/06/2018 05/14/13   Cliffton Astersampbell, John, MD    Family History Family History  Adopted: Yes    Social History Social History   Tobacco Use  . Smoking status: Current Every Day Smoker    Packs/day: 0.10    Types: Cigarettes  . Smokeless tobacco: Never Used  Substance Use Topics  . Alcohol use: Yes    Comment: social  . Drug use: No     Allergies   Patient has no known allergies.   Review of Systems Review  of Systems  Skin: Positive for wound.  All other systems reviewed and are negative.    Physical Exam Updated Vital Signs BP (!) 130/94 (BP Location: Left Arm)   Pulse 76   Temp 98.2 F (36.8 C) (Oral)   Resp 16   SpO2 100%   Physical Exam Constitutional:      Appearance: He is well-developed.  HENT:     Head: Normocephalic.     Nose: Nose normal.  Eyes:     General: Lids are normal.  Neck:     Musculoskeletal: Normal range of motion.  Cardiovascular:     Rate and Rhythm: Normal rate.     Comments: Brisk  cap refill to the right index fingertip Pulmonary:     Effort: Pulmonary effort is normal. No respiratory distress.  Musculoskeletal: Normal range of motion.     Comments:  strength and range of motion is intact at the level of the DIP, PIP and MCP of the right index finger  Skin:    Findings: Laceration present.     Comments: 3 cm straight laceration with some gaping in the center located at the palmar aspect of the right index finger at the crease of the MTP. Subcutaneous tissue exposed to through the laceration, no obvious tendon exposed  Neurological:     Mental Status: He is alert.     Comments: sensation to sharp, dull and two-point discrimination intact in the right index fingertip  Psychiatric:        Behavior: Behavior normal.      ED Treatments / Results  Labs (all labs ordered are listed, but only abnormal results are displayed) Labs Reviewed - No data to display  EKG None  Radiology No results found.  Procedures .Marland Kitchen.Laceration Repair  Date/Time: 09/07/2018 12:50 AM Performed by: Liberty HandyGibbons, Lytle Malburg J, PA-C Authorized by: Liberty HandyGibbons, Faylinn Schwenn J, PA-C   Consent:    Consent obtained:  Verbal   Consent given by:  Patient   Risks discussed:  Infection, need for additional repair, pain, poor cosmetic result and poor wound healing   Alternatives discussed:  No treatment and delayed treatment Universal protocol:    Procedure explained and questions answered to patient or proxy's satisfaction: yes     Relevant documents present and verified: yes     Test results available and properly labeled: yes     Imaging studies available: yes     Required blood products, implants, devices, and special equipment available: yes     Site/side marked: yes     Immediately prior to procedure, a time out was called: yes     Patient identity confirmed:  Verbally with patient Anesthesia (see MAR for exact dosages):    Anesthesia method:  Local infiltration   Local anesthetic:  Lidocaine 2%  WITH epi Laceration details:    Location:  Finger   Finger location:  R index finger   Length (cm):  3 Repair type:    Repair type:  Intermediate Pre-procedure details:    Preparation:  Patient was prepped and draped in usual sterile fashion and imaging obtained to evaluate for foreign bodies Exploration:    Hemostasis achieved with:  Epinephrine and direct pressure   Wound exploration: wound explored through full range of motion and entire depth of wound probed and visualized     Wound extent: no foreign bodies/material noted, no nerve damage noted, no tendon damage noted and no vascular damage noted     Contaminated: no   Treatment:  Area cleansed with:  Saline   Amount of cleaning:  Standard   Irrigation method:  Syringe   Visualized foreign bodies/material removed: no   Skin repair:    Repair method:  Sutures   Suture size:  4-0   Wound skin closure material used: ethilon.   Suture technique:  Simple interrupted   Number of sutures:  3 Approximation:    Approximation:  Close Post-procedure details:    Dressing:  Antibiotic ointment and non-adherent dressing   Patient tolerance of procedure:  Tolerated well, no immediate complications   (including critical care time)  Medications Ordered in ED Medications  bacitracin ointment (has no administration in time range)  lidocaine-EPINEPHrine (XYLOCAINE W/EPI) 2 %-1:200000 (PF) injection 10 mL (10 mLs Infiltration Given 09/07/18 0017)  Tdap (BOOSTRIX) injection 0.5 mL (0.5 mLs Intramuscular Given 09/07/18 0015)     Initial Impression / Assessment and Plan / ED Course  I have reviewed the triage vital signs and the nursing notes.  Pertinent labs & imaging results that were available during my care of the patient were reviewed by me and considered in my medical decision making (see chart for details).       Patient is a 23 y.o. yo male that presents with laceration. Tdap booster given. Pressure irrigation performed. Bottom  of the wound visualized with bleeding controled, no foreign bodies seen.  No tendon or nerve injury noted. Full ROM of affected extremity. Laceration occurred < 12 hours prior to repair which was well tolerated. Pt has no co morbidities to effect normal wound healing. Discussed suture home care with pt and answered questions. Pt to follow up for wound check and suture removal in 7 days. Pt is hemodynamically stable w no complaints prior to dc.      Final Clinical Impressions(s) / ED Diagnoses   Final diagnoses:  Laceration of right index finger without foreign body without damage to nail, initial encounter    ED Discharge Orders    None       Arlean Hopping 09/07/18 0051    Palumbo, April, MD 09/07/18 [id number]

## 2018-09-07 NOTE — Discharge Instructions (Signed)
Your laceration was repaired with 3 sutures today. Your tetanus shot was up dated.  Your sutures need to come out within 7-10 days.   The original dressing should be left in place for 24 hours.  If your laceration is small enough, you can remove original dressing after 24 hours after which laceration can be opened to air. Laceration can then be gently cleaned with mild soap and water after 24 hours of laceration repair to prevent crusting over the suture knots. An antibiotic ointment can be applied to the wound as well, twice daily until suture removal. If your laceration is large or can be contaminated you can cover it with a dressing throughout the day.   You may shower or wash the wound with soap and water. Avoid prolonged soaking of stitches including swimming in chlorinated water, pools, hot tubs. Do not swim or soak in natural bodies of water because of a potential increased risk of infection.   Return for swelling, pain, redness, pus, fevers.

## 2018-09-16 ENCOUNTER — Emergency Department (HOSPITAL_COMMUNITY)
Admission: EM | Admit: 2018-09-16 | Discharge: 2018-09-16 | Disposition: A | Discharge status: Home / Self Care | Payer: Medicaid Other | Attending: Emergency Medicine | Admitting: Emergency Medicine

## 2018-09-16 ENCOUNTER — Encounter (HOSPITAL_COMMUNITY): Payer: Self-pay

## 2018-09-16 ENCOUNTER — Other Ambulatory Visit: Payer: Self-pay

## 2018-09-16 DIAGNOSIS — B20 Human immunodeficiency virus [HIV] disease: Secondary | ICD-10-CM | POA: Diagnosis not present

## 2018-09-16 DIAGNOSIS — Z4802 Encounter for removal of sutures: Secondary | ICD-10-CM

## 2018-09-16 DIAGNOSIS — F17210 Nicotine dependence, cigarettes, uncomplicated: Secondary | ICD-10-CM | POA: Insufficient documentation

## 2018-09-16 DIAGNOSIS — F70 Mild intellectual disabilities: Secondary | ICD-10-CM | POA: Diagnosis not present

## 2018-09-16 DIAGNOSIS — Z79899 Other long term (current) drug therapy: Secondary | ICD-10-CM | POA: Insufficient documentation

## 2018-09-16 NOTE — ED Provider Notes (Signed)
[facility]-EMERGENCY DEPT Provider Note   CSN: [id number] Arrival date & time: 09/16/18  1732     History   Chief Complaint Chief Complaint  Patient presents with  . needs sutures removed    HPI Otto HerbJeremiah Nesmith is a 23 y.o. male.     HPI   Patient is a 23 year old male with a history of HIV, hep B, who presents emergency department today for suture removal.  Patient had sutures placed on 6/26, 10 days ago after he cut his finger while cooking.  He had 3 sutures placed.  He denies any redness, swelling, pain or drainage of pus.  No significant pain.  Past Medical History:  Diagnosis Date  . Hepatitis B   . HIV (human immunodeficiency virus infection) [facility](HCC)     Patient Active Problem List   Diagnosis Date Noted  . 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 ADHD (attention deficit hyperactivity disorder) 10/23/2012  . 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 Unspecified essential hypertension 10/23/2012  . Human immunodeficiency virus (HIV) disease (HCC) 10/02/2012  . Chronic hepatitis B (HCC) 10/02/2012  . Systolic hypertension 10/02/2012  . Blindness of left eye 10/02/2012    Past Surgical History:  Procedure Laterality Date  . EYE SURGERY          Home Medications    Prior to Admission medications   Medication Sig Start Date End Date Taking? Authorizing Provider  darunavir (PREZISTA) 600 MG tablet Take 1 tablet (600 mg total) by mouth 2 (two) times daily with a meal. Patient not taking: Reported on 09/06/2018 05/14/13   Cliffton Astersampbell, John, MD  dolutegravir (TIVICAY) 50 MG tablet Take 1 tablet (50 mg total) by mouth 2 (two) times daily. 05/14/13   Cliffton Astersampbell, John, MD  doravirin-lamivudin-tenofov df (DELSTRIGO) 100-300-300 MG TABS per tablet Take 1 tablet by mouth daily.  04/10/18   [provider]   emtricitabine-tenofovir (TRUVADA) 200-300 MG per tablet Take 1 tablet by mouth daily. Patient not taking: Reported on 09/06/2018 05/14/13   Cliffton Astersampbell, John, MD  HYDROcodone-acetaminophen (NORCO/VICODIN) 5-325 MG tablet Take 1 tablet by mouth every 6 (six) hours as needed for severe pain. Patient not taking: Reported on 09/06/2018 03/31/16   Antony MaduraHumes, Kelly, PA-C  naproxen (NAPROSYN) 375 MG tablet Take 1 tablet (375 mg total) by mouth 2 (two) times daily. Patient not taking: Reported on 09/06/2018 05/17/18   Janne NapoleonNeese, Hope M, NP  NORVIR 100 MG TABS tablet TAKE 1 TABLET BY MOUTH TWICE DAILY WITH A MEAL Patient not taking: Reported on 09/06/2018 06/08/14   Judyann MunsonSnider, Cynthia, MD  ritonavir (NORVIR) 100 MG TABS tablet Take 1 tablet (100 mg total) by mouth 2 (two) times daily with a meal. Patient not taking: Reported on 09/06/2018 05/14/13   Cliffton Astersampbell, John, MD    Family History Family History  Adopted: Yes  Family history unknown: Yes    Social History Social History   Tobacco Use  . Smoking status: Current Every Day Smoker    Packs/day: 0.10    Types: Cigarettes  . Smokeless tobacco: Never Used  Substance Use Topics  . Alcohol use: Yes    Comment: social  . Drug use: No     Allergies   Patient has no known allergies.   Review of Systems Review of Systems  Constitutional: Negative for fever.  Skin: Positive for wound. Negative for color change.  Physical Exam Updated Vital Signs BP 125/66 (BP Location: Right Arm)   Pulse 66   Temp 98.2 F (36.8 C) (Oral)   Resp 16   Ht 5\' 9"  (1.753 m)   Wt 66.2 kg   SpO2 100%   BMI 21.56 kg/m   Physical Exam Constitutional:      General: He is not in acute distress.    Appearance: He is well-developed.  Eyes:     Conjunctiva/sclera: Conjunctivae normal.  Cardiovascular:     Rate and Rhythm: Normal rate and regular rhythm.  Pulmonary:     Effort: Pulmonary effort is normal.     Breath sounds: Normal breath sounds.  Musculoskeletal:      Comments: Well-healing laceration to the base of the right first digit, ventral aspect.  No erythema, warmth, tenderness or drainage of pus.  3 sutures in place.  Skin:    General: Skin is warm and dry.  Neurological:     Mental Status: He is alert and oriented to person, place, and time.      ED Treatments / Results  Labs (all labs ordered are listed, but only abnormal results are displayed) Labs Reviewed - No data to display  EKG None  Radiology No results found.  Procedures Procedures (including critical care time)  Medications Ordered in ED Medications - No data to display   Initial Impression / Assessment and Plan / ED Course  I have reviewed the triage vital signs and the nursing notes.  Pertinent labs & imaging results that were available during my care of the patient were reviewed by me and considered in my medical decision making (see chart for details).   Final Clinical Impressions(s) / ED Diagnoses   Final diagnoses:  Visit for suture removal   Pt to ER for suture removal and wound check as above. Procedure tolerated well. Vitals normal, no signs of infection. Scar minimization & return precautions given at dc.    ED Discharge Orders    None       Bishop Dublin 09/16/18 1938    Valarie Merino, MD 09/16/18 2051

## 2018-09-16 NOTE — ED Triage Notes (Signed)
Patient needs sutures removed from right pointer finger. Patient states it has been 7 days since sutures place.

## 2018-09-16 NOTE — Discharge Instructions (Addendum)
Please follow up with your primary care provider within 5-7 days for re-evaluation of your symptoms. If you do not have a primary care provider, information for a healthcare clinic has been provided for you to make arrangements for follow up care.  Please return to the emergency room immediately if you experience any new or worsening symptoms or any symptoms that indicate worsening infection such as fevers, increased redness/swelling/pain, warmth, or drainage from the affected area.    

## 2019-06-22 ENCOUNTER — Encounter (HOSPITAL_COMMUNITY): Payer: Self-pay | Admitting: Emergency Medicine

## 2019-06-22 ENCOUNTER — Other Ambulatory Visit: Payer: Self-pay

## 2019-06-22 ENCOUNTER — Emergency Department (HOSPITAL_COMMUNITY)
Admission: EM | Admit: 2019-06-22 | Discharge: 2019-06-23 | Disposition: A | Discharge status: Home / Self Care | Payer: Medicaid Other | Attending: Emergency Medicine | Admitting: Emergency Medicine

## 2019-06-22 DIAGNOSIS — Z5321 Procedure and treatment not carried out due to patient leaving prior to being seen by health care provider: Secondary | ICD-10-CM | POA: Diagnosis not present

## 2019-06-22 DIAGNOSIS — R319 Hematuria, unspecified: Secondary | ICD-10-CM | POA: Diagnosis present

## 2019-06-22 LAB — URINALYSIS, ROUTINE W REFLEX MICROSCOPIC
Bacteria, UA: NONE SEEN
Bilirubin Urine: NEGATIVE
Glucose, UA: NEGATIVE mg/dL
Hgb urine dipstick: NEGATIVE
Ketones, ur: NEGATIVE mg/dL
Nitrite: NEGATIVE
Protein, ur: NEGATIVE mg/dL
Specific Gravity, Urine: 1.028 (ref 1.005–1.030)
pH: 5 (ref 5.0–8.0)

## 2019-06-22 NOTE — ED Triage Notes (Addendum)
Pt reports hematuria that started yesterday. Pt reports no episode of hematuria today. Denies any trauma. Denies pain. Pt reports his urine is mostly yellow and that the blood is mostly in his semen.

## 2019-06-23 NOTE — ED Notes (Signed)
Patient asks how many people are in front of him. This NT told him there are 11 people that have waited longer than him. He states "heck na I have to work in the morning." I encouraged him to stay and told him if he stays he can get a work note. 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 States he will come back if the problem persists. Encouraged him to sign up for mychart so he can see his results.

## 2020-03-12 IMAGING — CR LEFT SHOULDER - 2+ VIEW
4 series · 4 of 4 positions shown · non-contrast
Comparison: None.

CLINICAL DATA: Left shoulder pain

EXAM:
LEFT SHOULDER - 2+ VIEW

[w shoulder external left]
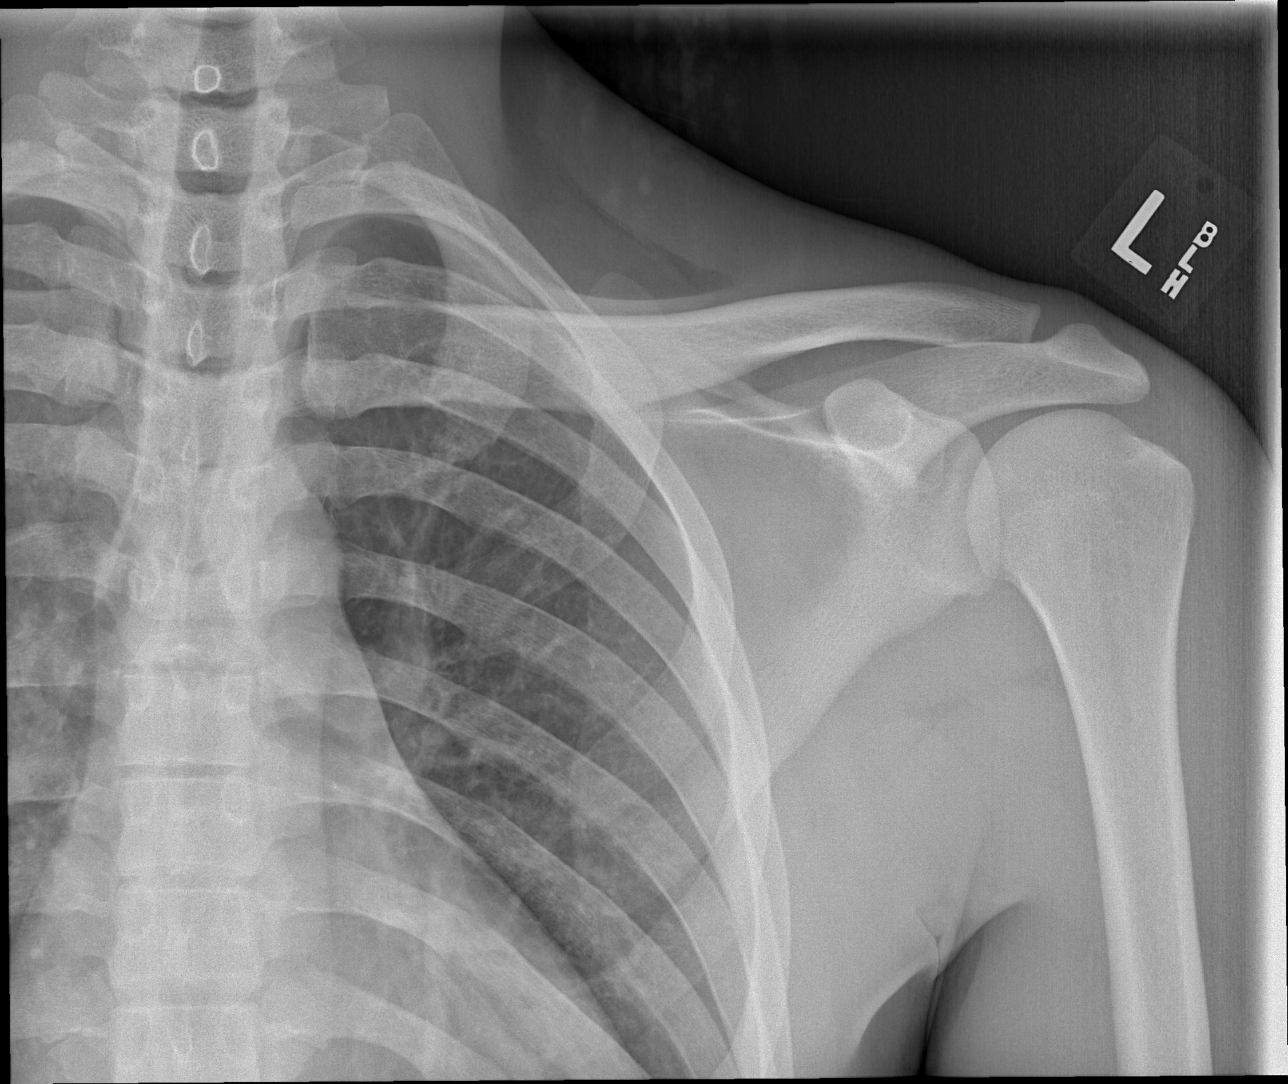

[w shoulder internal left]
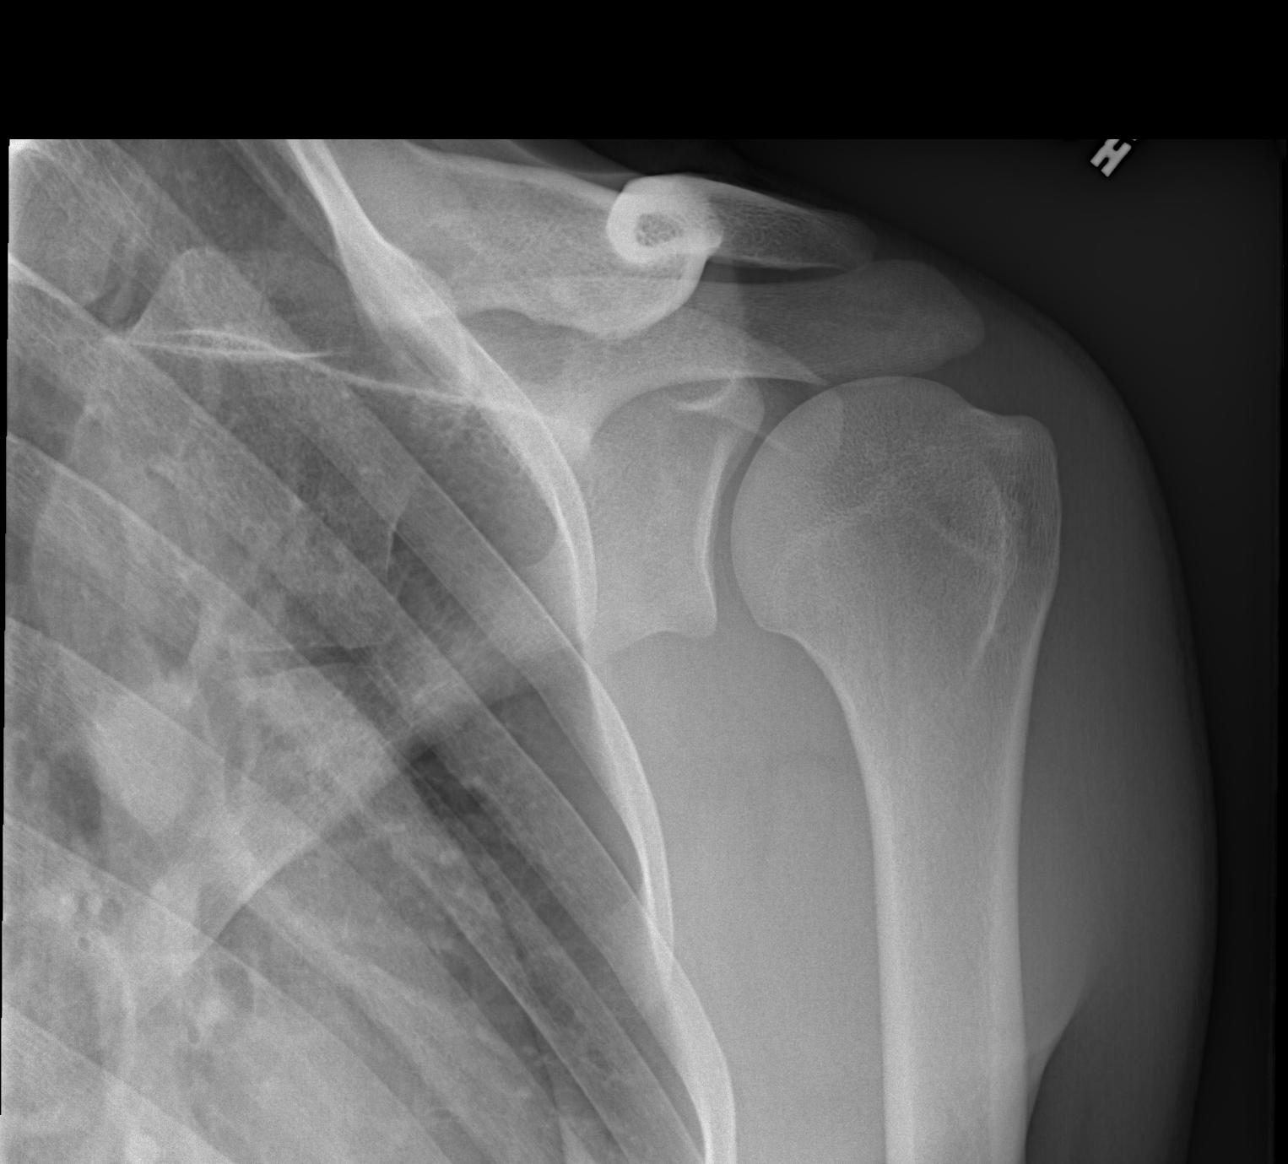

[w shoulder y-view left]
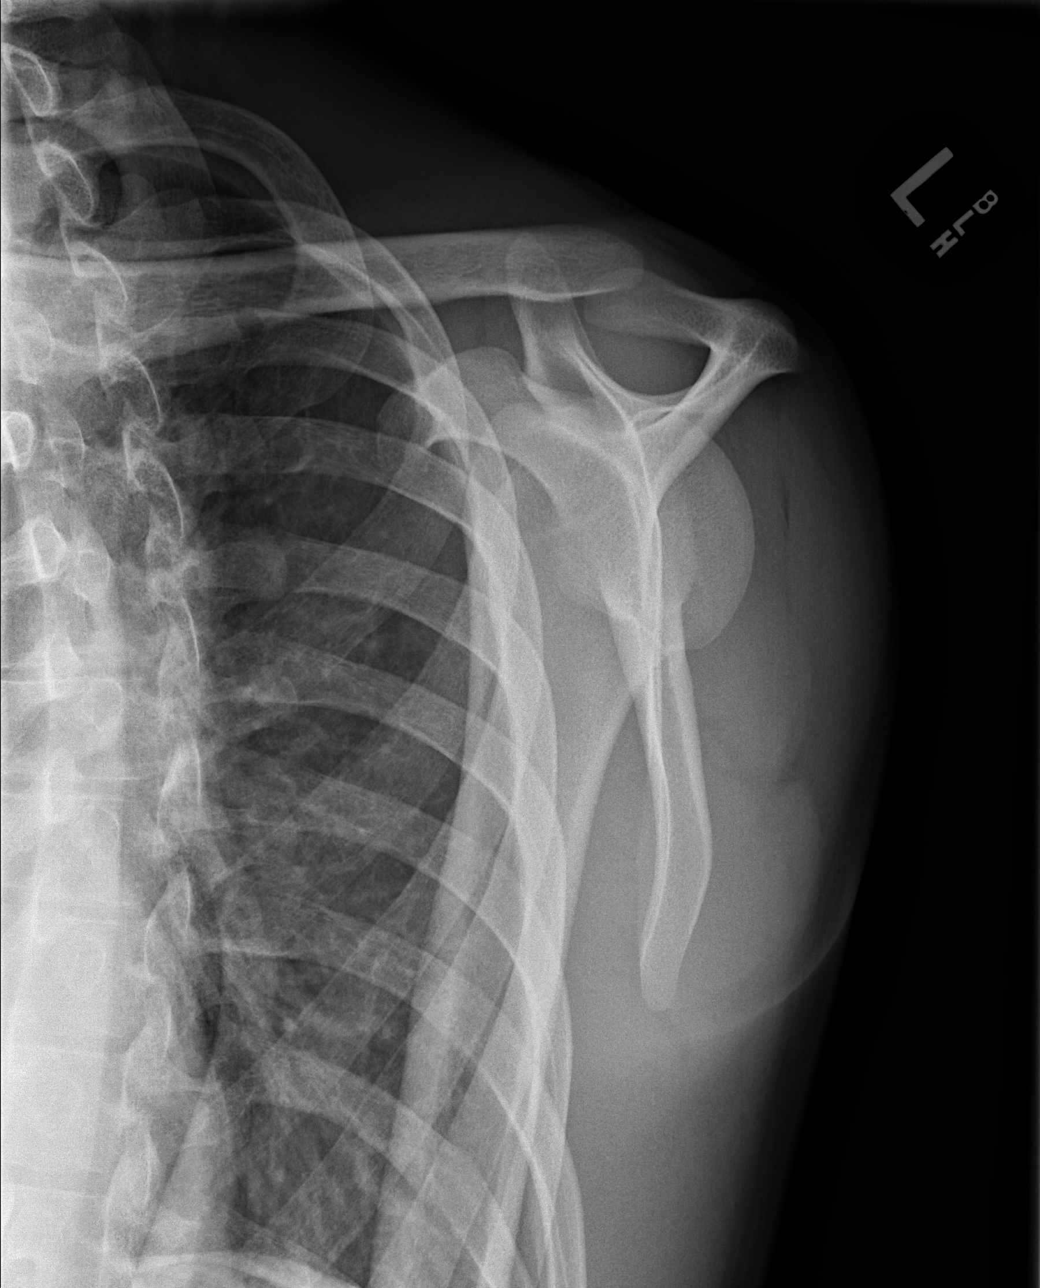

[x shoulder axillary left]
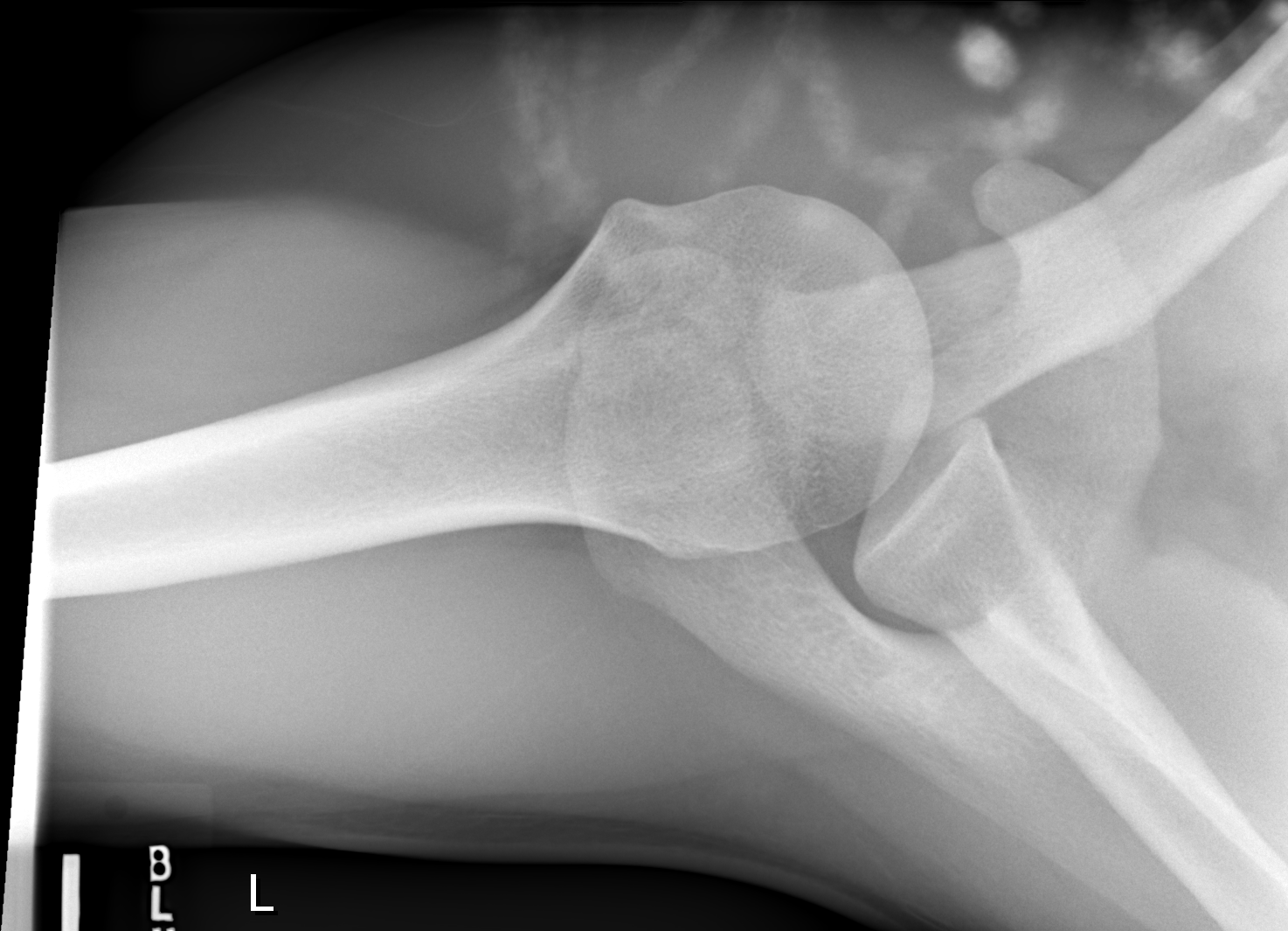

[4 of 4 positions shown; findings below may reference images not displayed]

FINDINGS: There is no evidence of fracture or dislocation. There is no
evidence of arthropathy or other focal bone abnormality. Soft
tissues are unremarkable.
IMPRESSION: Negative.

## 2020-08-23 ENCOUNTER — Other Ambulatory Visit: Payer: Self-pay | Admitting: Family

## 2020-08-23 DIAGNOSIS — B20 Human immunodeficiency virus [HIV] disease: Secondary | ICD-10-CM

## 2020-08-23 NOTE — Progress Notes (Unsigned)
.  comm 

## 2021-08-11 ENCOUNTER — Other Ambulatory Visit: Payer: Medicaid Other

## 2021-08-11 ENCOUNTER — Other Ambulatory Visit: Payer: Self-pay

## 2021-08-11 DIAGNOSIS — Z79899 Other long term (current) drug therapy: Secondary | ICD-10-CM

## 2021-08-11 DIAGNOSIS — B20 Human immunodeficiency virus [HIV] disease: Secondary | ICD-10-CM

## 2021-08-11 DIAGNOSIS — Z113 Encounter for screening for infections with a predominantly sexual mode of transmission: Secondary | ICD-10-CM

## 2021-08-24 ENCOUNTER — Other Ambulatory Visit (HOSPITAL_COMMUNITY): Payer: Self-pay

## 2021-08-25 ENCOUNTER — Encounter: Payer: Self-pay | Admitting: Family

## 2021-08-25 ENCOUNTER — Ambulatory Visit (INDEPENDENT_AMBULATORY_CARE_PROVIDER_SITE_OTHER): Payer: Medicaid Other | Admitting: Family

## 2021-08-25 ENCOUNTER — Other Ambulatory Visit: Payer: Self-pay

## 2021-08-25 ENCOUNTER — Ambulatory Visit (INDEPENDENT_AMBULATORY_CARE_PROVIDER_SITE_OTHER): Payer: Medicaid Other | Admitting: Pharmacist

## 2021-08-25 VITALS — BP 128/79 | HR 68 | Temp 98.2°F | Wt 153.0 lb

## 2021-08-25 DIAGNOSIS — B20 Human immunodeficiency virus [HIV] disease: Secondary | ICD-10-CM

## 2021-08-25 DIAGNOSIS — Z5181 Encounter for therapeutic drug level monitoring: Secondary | ICD-10-CM

## 2021-08-25 DIAGNOSIS — F3341 Major depressive disorder, recurrent, in partial remission: Secondary | ICD-10-CM | POA: Diagnosis not present

## 2021-08-25 DIAGNOSIS — Z79899 Other long term (current) drug therapy: Secondary | ICD-10-CM | POA: Diagnosis not present

## 2021-08-25 DIAGNOSIS — B181 Chronic viral hepatitis B without delta-agent: Secondary | ICD-10-CM

## 2021-08-25 DIAGNOSIS — Z113 Encounter for screening for infections with a predominantly sexual mode of transmission: Secondary | ICD-10-CM

## 2021-08-25 MED ORDER — BIKTARVY 50-200-25 MG PO TABS: 1.0000 | ORAL_TABLET | Freq: Every day | ORAL | 2 refills | Status: DC

## 2021-08-25 MED ORDER — PIFELTRO 100 MG PO TABS: 100.0000 mg | ORAL_TABLET | Freq: Every day | ORAL | 2 refills | Status: DC

## 2021-08-25 NOTE — Progress Notes (Signed)
HPI: Timothy Wall is a 26 y.o. male who presents to the [facility] clinic today to transfer HIV care with Marcos Eke, NP.  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   NAPROXEN (NAPROSYN) 375 MG TABLET    Take 1 tablet (375 mg total) by mouth 2 (two) times daily.  Modified Medications   Modified Medication Previous Medication   BIKTARVY 50-200-25 MG TABS TABLET BIKTARVY 50-200-25 MG TABS tablet      Take 1 tablet by mouth daily.    Take 1 tablet by mouth daily.   PIFELTRO 100 MG TABS TABLET PIFELTRO 100 MG TABS tablet      Take 1 tablet (100 mg total) by mouth daily.    Take 100 mg by mouth daily.  Discontinued Medications   DARUNAVIR (PREZISTA) 600 MG TABLET    Take 1 tablet (600 mg total) by mouth 2 (two) times daily with a meal.   DOLUTEGRAVIR (TIVICAY) 50 MG TABLET    Take 1 tablet (50 mg total) by mouth 2 (two) times daily.   DORAVIRIN-LAMIVUDIN-TENOFOV DF (DELSTRIGO) 100-300-300 MG TABS PER TABLET    Take 1 tablet by mouth daily.    EMTRICITABINE-TENOFOVIR (TRUVADA) 200-300 MG PER TABLET    Take 1 tablet by mouth daily.   NORVIR 100 MG TABS TABLET    TAKE 1 TABLET BY MOUTH TWICE DAILY WITH A MEAL   RITONAVIR (NORVIR) 100 MG TABS TABLET    Take 1 tablet  (100 mg total) by mouth 2 (two) times daily with a meal.    Allergies: No Known Allergies  Past Medical History: Past Medical History:  Diagnosis Date   Hepatitis B    HIV (human immunodeficiency virus infection) (HCC)     Social History: Social History   Socioeconomic History   Marital status: Single    Spouse name: Not on file   Number of children: Not on file   Years of education: Not on file   Highest education level: Not on file  Occupational History   Not on file  Tobacco Use   Smoking status: Every Day    Packs/day: 0.25    Types: Cigarettes   Smokeless tobacco: Never   Tobacco comments:    Former cigarette smoker    vape  Building services engineer Use: Never used  Substance and Sexual Activity   Alcohol use: Yes   Drug use: No   Sexual activity: Not Currently    Partners: Female    Comment: given condoms  Other Topics Concern   Not on file  Social History Narrative   Not on file   Social Determinants of Health   Financial Resource Strain: Not on file  Food Insecurity: Not on file  Transportation Needs: Not on file  Physical Activity: Not on file  Stress: Not  on file  Social Connections: Not on file    Labs: Lab Results  Component Value Date   HIV1RNAQUANT <20 06/30/2013   HIV1RNAQUANT <20 04/17/2013   HIV1RNAQUANT 5,319 (H) 02/20/2013   CD4TABS 380 (L) 06/30/2013   CD4TABS 730 04/17/2013   CD4TABS 320 (L) 02/20/2013    RPR and STI Lab Results  Component Value Date   LABRPR NON REAC 10/22/2012        No data to display          Hepatitis B Lab Results  Component Value Date   HEPBSAB NEG 10/22/2012   HEPBSAG POSITIVE (A) 10/22/2012   HEPBCAB POS (A) 10/22/2012   Hepatitis C No results found for: "HEPCAB", "HCVRNAPCRQN" Hepatitis A Lab Results  Component Value Date   HAV NEG 10/22/2012   Lipids: Lab Results  Component Value Date   CHOL 111 10/22/2012   TRIG 90 10/22/2012   HDL 41 10/22/2012   CHOLHDL 2.7 10/22/2012    VLDL 18 10/22/2012   LDLCALC 52 10/22/2012    Current HIV Regimen: Biktarvy + Pifeltro  Assessment: Keshon is here today to transfer his HIV care to our clinic with Marcos Eke, NP.  He is currently prescribed Biktarvy/Pifeltro but struggles to maintain proper adherence. He seemed unaware of which medicines he was taking and was rather disengaged during our encounter today. Emphasized the importance of taking his medicines everyday as his virus already displays resistance to a few medications. Discussed that continued non-adherence can introduce further mutations. Counseled that this can mean he has to transition to less ideal regimens that have more side effects, require to be taken with meals, and that interact with many medicines. He verbalized understanding and stated he would try to take his medicine everyday.   Of note, per [facility]'s notes, he has genotypes from 03/22/20 and 04/21/14 that displayed low level resistance to Pifeltro. Greg to dig deeper into prior charts as well as check genotype today for possible therapy adjustment. Patient requested medicines be sent to [facility] mail order pharmacy. Discussed that we have a specialty pharmacy that can mail prescriptions out as well should he have any issues with his current pharmacy.   Plan: Continue Biktarvy/Pifeltro  Follow-up on genotype for need to adjust regimen  Margarite Gouge, PharmD, CPP Clinical Pharmacist Practitioner Infectious Diseases Clinical Pharmacist [facility] 08/25/2021, 4:04 PM

## 2021-08-25 NOTE — Patient Instructions (Addendum)
Nice to see you.  We will check your lab work today.  Take your medication daily as prescribed.  Refills have been sent to the pharmacy.  Plan for follow up in 1 months or sooner if needed with lab work on the same day.  Have a great day and stay safe!

## 2021-08-25 NOTE — Progress Notes (Signed)
Brief Narrative   Patient ID: Timothy Wall, male    DOB: 06-23-1995, 26 y.o.   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 Initial CD4 count, viral load, and genotype are unavailable. 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   Subjective:    Chief Complaint  Patient presents with   New Patient (Initial Visit)    b20    HPI:  Timothy Wall is a 26 y.o. male with previous medical history of ADHD, hypertension, blindness of the left eye, HIV disease (perinatal), and chronic Hepatitis B presenting today to transfer care from [facility].   Timothy Wall was perinatally infected with HIV disease with both mother and father positive for HIV. Initial CD4 count, viral load and genotype are not available. No history of opportunistic infection. From chart review has poor adherence with medications for multiple reasons including psychiatric and substance use. Genotype from 02/20/13  with NRTI mutations of M41L, M184V, T215Y conferring resistance to zidovudine, emtriciabine, lamivudine, and intermediate resistance to abacavir and low level resistance to tenofovir; NNRTI mutations of L100I, V179D conferring resistance to rilpivirine, nevirapine, and efavrienz with intermediate resistance to etravirine, and low level resistance to doravirine; and PI mutations D30N and N88D conferring low level resistance to atazanivir/r with susceptibility to  darunavir/r, and lopinavir/r. Has previous ART history that started with birth includes norvir, Tivicay, Truvada, Prezista, Prezcobix, Destrigo, Descovy, [place], and [place]. Last follow up with ID provider was telehealth on 02/07/21 with reported improvement in medication adherence. Last lab work available for review from 11/03/20 with viral load of 1580 and CD4 count of 290.  Timothy Wall has been off of his medications for at least the last 2 months because "I've been in a mood lately." Has been having increased levels of depression as of late with the anniversary of the passing of his daughter which triggers him in having depression. Was seen in counseling at [place] with last counseling appointment on 03/22/21. Here today with his partner. Continues to drink alcohol with no recreational/illicit drug use and daily tobacco use. Condoms offered.         No Known Allergies    Outpatient Medications Prior to Visit  Medication Sig Dispense Refill   BIKTARVY 50-200-25 MG TABS tablet Take 1 tablet by mouth daily.     PIFELTRO 100 MG TABS tablet Take 100 mg by mouth daily.     darunavir (PREZISTA) 600 MG tablet Take 1 tablet (600 mg total) by mouth 2 (two) times daily with a meal. (Patient not taking: Reported on 09/06/2018) 60 tablet 11   dolutegravir (TIVICAY) 50 MG tablet Take 1 tablet (50 mg total) by mouth 2 (two) times daily. (Patient not taking: Reported on 08/25/2021) 60 tablet 11   doravirin-lamivudin-tenofov df (DELSTRIGO) 100-300-300 MG TABS per tablet Take 1 tablet by mouth daily.  (Patient not taking: Reported on 08/25/2021)     emtricitabine-tenofovir (TRUVADA) 200-300 MG per tablet Take 1 tablet by mouth daily. (Patient not taking: Reported on 09/06/2018) 30 tablet 6  HYDROcodone-acetaminophen (NORCO/VICODIN) 5-325 MG tablet Take 1 tablet by mouth every 6 (six) hours as needed for severe pain. (Patient not taking: Reported on 09/06/2018) 7 tablet 0   naproxen  (NAPROSYN) 375 MG tablet Take 1 tablet (375 mg total) by mouth 2 (two) times daily. (Patient not taking: Reported on 09/06/2018) 20 tablet 0   NORVIR 100 MG TABS tablet TAKE 1 TABLET BY MOUTH TWICE DAILY WITH A MEAL (Patient not taking: No sig reported) 60 tablet 0   ritonavir (NORVIR) 100 MG TABS tablet Take 1 tablet (100 mg total) by mouth 2 (two) times daily with a meal. (Patient not taking: Reported on 09/06/2018) 60 tablet 11   No facility-administered medications prior to visit.     Past Medical History:  Diagnosis Date   Hepatitis B    HIV (human immunodeficiency virus infection) ([place])      Past Surgical History:  Procedure Laterality Date   EYE SURGERY        Review of Systems  Constitutional:  Negative for appetite change, chills, fatigue, fever and unexpected weight change.  Eyes:  Negative for visual disturbance.  Respiratory:  Negative for cough, chest tightness, shortness of breath and wheezing.   Cardiovascular:  Negative for chest pain and leg swelling.  Gastrointestinal:  Negative for abdominal pain, constipation, diarrhea, nausea and vomiting.  Genitourinary:  Negative for dysuria, flank pain, frequency, genital sores, hematuria and urgency.  Skin:  Negative for rash.  Allergic/Immunologic: Negative for immunocompromised state.  Neurological:  Negative for dizziness and headaches.      Objective:    BP 128/79   Pulse 68   Temp 98.2 F (36.8 C) (Oral)   Wt 153 lb (69.4 kg)   BMI 22.59 kg/m  Nursing note and vital signs reviewed.  Physical Exam Constitutional:      General: He is not in acute distress.    Appearance: He is well-developed.  Eyes:     Conjunctiva/sclera: Conjunctivae normal.  Cardiovascular:     Rate and Rhythm: Normal rate and regular rhythm.     Heart sounds: Normal heart sounds. No murmur heard.    No friction rub. No gallop.  Pulmonary:     Effort: Pulmonary effort is normal. No respiratory distress.     Breath sounds: Normal  breath sounds. No wheezing or rales.  Chest:     Chest wall: No tenderness.  Abdominal:     General: Bowel sounds are normal.     Palpations: Abdomen is soft.     Tenderness: There is no abdominal tenderness.  Musculoskeletal:     Cervical back: Neck supple.  Lymphadenopathy:     Cervical: No cervical adenopathy.  Skin:    General: Skin is warm and dry.     Findings: No rash.  Neurological:     Mental Status: He is alert and oriented to person, place, and time.  Psychiatric:        Behavior: Behavior normal.        Thought Content: Thought content normal.        Judgment: Judgment normal.         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 Currently covered with tenofovir with Biktarvy. No signs/symptoms of Hepatitis B flare. Will check ultrasound at next visit  for routine screening of HCC. Continue current dose of Biktarvy.       Relevant Medications   BIKTARVY 50-200-25 MG TABS tablet   PIFELTRO 100 MG TABS tablet     Other   Human immunodeficiency virus (HIV) disease (HCC) - Primary (Chronic)    Timothy Wall has poorly controlled virus with history of less than optimal adherence to his medication regimen that is likely multifactorial. Treatment experienced as he is perinatally infected and unfortunately has significant amount of resistance. There is some conflicting resistance amongst medications. Check genotype today. Will continue current dose of Biktarvy and will need to change Pilfeltro as there is probable doravirine resistance on most recent Genotype from February 2022. Discussed with ID pharmacy and will check on changing to lenacapravir given his history of taking pills and concern for pill size. Plan for follow up in 1 month or sooner if needed with lab work on the same day.        Relevant Medications   BIKTARVY 50-200-25 MG TABS tablet   PIFELTRO 100 MG TABS tablet   Other Relevant Orders   Comprehensive metabolic panel (Completed)   CBC with Differential/Platelet (Completed)   HIV RNA, RTPCR W/R GT (RTI, PI,INT)   T-helper cell (CD4)- ([facility] clinic only) (Completed)   HLA [id number]   Recurrent major depressive disorder, in partial remission [facility])    Timothy Wall has continued depression with triggers including relationships, alcohol use and the anniversary of the death of his daughter. Was previously seen by counseling at [facility]. No suicidal ideation or signs of psychosis. Not currently on medication. Recommend continued counseling as this is a major contributing factor his poorly controlled HIV.       Other Visit Diagnoses     Screening for STDs (sexually transmitted diseases)       Relevant Orders   RPR   Therapeutic drug monitoring       Pharmacologic therapy       Relevant Orders   Lipid panel (Completed)         I have discontinued Cecil Cobbs HYDROcodone-acetaminophen and naproxen. I have also changed his Pifeltro. Additionally, I am having him maintain his Biktarvy.   Meds ordered this encounter  Medications   BIKTARVY 50-200-25 MG TABS tablet    Sig: Take 1 tablet by mouth daily.    Dispense:  30 tablet    Refill:  2    Order Specific Question:   Supervising Provider  Answer:   SNIDER, CYNTHIA [[id number]]   PIFELTRO 100 MG TABS tablet    Sig: Take 1 tablet (100 mg total) by mouth daily.    Dispense:  30 tablet    Refill:  2    Order Specific Question:   Supervising Provider    Answer:   Carlyle Basques [[id number]]     A total of 85 minutes was spent on this visit reviewing previous notes, counseling the patient on HIV medication, medication adherence, ordering tests (HIV RNA genotype, RPR, CD4 count, CMET, lipid profile, and [id number]), adjusting meds, and documenting the findings in the note   Follow-up: Return in about 1 month (around 09/24/2021), or if symptoms worsen or fail to improve.   Terri Piedra, MSN, FNP-C Nurse Practitioner [facility] [place] number: [phone]

## 2021-08-26 ENCOUNTER — Other Ambulatory Visit (HOSPITAL_COMMUNITY): Payer: Self-pay

## 2021-08-26 ENCOUNTER — Encounter: Payer: Self-pay | Admitting: Family

## 2021-08-26 ENCOUNTER — Telehealth: Payer: Self-pay | Admitting: Pharmacist

## 2021-08-26 DIAGNOSIS — F3341 Major depressive disorder, recurrent, in partial remission: Secondary | ICD-10-CM | POA: Insufficient documentation

## 2021-08-26 LAB — T-HELPER CELL (CD4) - (RCID CLINIC ONLY)
CD4 % Helper T Cell: 23 % — ABNORMAL LOW (ref 33–65)
CD4 T Cell Abs: 268 /uL — ABNORMAL LOW (ref 400–1790)

## 2021-08-26 NOTE — Assessment & Plan Note (Signed)
Timothy Wall has poorly controlled virus with history of less than optimal adherence to his medication regimen that is likely multifactorial. Treatment experienced as he is perinatally infected and unfortunately has significant amount of resistance. There is some conflicting resistance amongst medications. Check genotype today. Will continue current dose of Biktarvy and will need to change Pilfeltro as there is probable doravirine resistance on most recent Genotype from February 2022. Discussed with ID pharmacy and will check on changing to lenacapravir given his history of taking pills and concern for pill size. Plan for follow up in 1 month or sooner if needed with lab work on the same day.

## 2021-08-26 NOTE — Assessment & Plan Note (Signed)
Timothy Wall has continued depression with triggers including relationships, alcohol use and the anniversary of the death of his daughter. Was previously seen by counseling at [facility]. No suicidal ideation or signs of psychosis. Not currently on medication. Recommend continued counseling as this is a major contributing factor his poorly controlled HIV.

## 2021-08-26 NOTE — Telephone Encounter (Signed)
Cumulative HIV Genotype Data  Genotype Dates: 04/21/14 (documented at [facility] on 11/03/20)                               + Genosure collected 03/22/20 (reported in [facility])  RT Mutations  M41L, M184V, T215CY, K219N, L100I, V179D, K20R, V35T, T39A, S48E, K103R, T200A, F214L, L228R, D250N, A272S, E312A, S322ST  PI Mutations D30N, N88D, I13V, G17E, E35D, M36V, N37D, I62V, L63P, I64M, A71V  Integrase Mutations  D232DN,  L45LV, F100M, L101I, T124A, I135V, G163E, K186KE, I200IM, V201I, T210TA, E212A, S230SG, A265V, K266KE, I268IV, D270DG   Interpretation of Genotype Data per [employer] HIV Drug Resistance Database:  Nucleoside RTIs  Abacavir - Intermediate resistance Zidovudine - High-level resistance Emtricitabine - High-level resistance Lamivudine - High-level resistance Tenofovir - Low-level resistance*  *M184V not identified on 2022 Genosure  Non-Nucleoside RTIs  Doravirine - Low-level resistance Efavirenz - High-level resistance Etravirine - Intermediate resistance Nevirapine - High-level resistance Rilpivirine - High-level resistance   Protease Inhibitors  Atazanavir - Potential low-level resistance Darunavir - Susceptible Lopinavir - Susceptible   Integrase Inhibitors  Bictegravir - Susceptible Cabotegravir - Susceptible Dolutegravir - Susceptible Elvitegravir - Potential low-level resistance Raltegravir - Potential low-level resistance   Past regimens per [facility] documentation:  04/2020-present: Biktarvy + Pifeltro   11/26/19-04/27/20: Tivicay + Pifeltro + Truvada   02/2017-11/2019: Delstrigo + Tivicay   05/2014-02/2017: Tivicay + Prezista + Norvir +Truvada   12/2013-05/2014: Tivicay + Prezcobix + Truvada   08/2013-12/2013: Tivicay + Prezista + Norvir + Truvada  pre-08/2013: Kaletra +Truvada   Greg plans to discuss updated regimen with patient pending new genotype results. Pifeltro not most robust regimen given low-level resistance. Biktarvy maintains activity  with bictegravir and tenofovir. Will plan to initiate Sunlenca if patient willing and able. Previous notes indicate patient refuses to take Prezcobix and Prezista due to large pill size. Symtuza and [place] similar if not larger size. Though Prezista and ritonavir available as liquids - would require increased patient education and willingness to adhere. Rukobia and maraviroc twice daily (pending tropism anyhow) would be difficult as patient struggles to take once daily medications (same for Fuzeon twice daily). Trogarzo also possible but certainly not ideal with need for frequent infusion visits.   Margarite Gouge, PharmD, CPP Clinical Pharmacist Practitioner Infectious Diseases Clinical Pharmacist [facility] for Infectious Disease

## 2021-08-26 NOTE — Assessment & Plan Note (Signed)
Timothy Wall has chronic inactive Hepatitis B with previous lab work showing undetectable Hepatitis B DNA level with positive Hepatitis B e antibody and negative Hepatitis B e antigen. Currently covered with tenofovir with Biktarvy. No signs/symptoms of Hepatitis B flare. Will check ultrasound at next visit for routine screening of HCC. Continue current dose of Biktarvy.

## 2021-09-18 LAB — CBC WITH DIFFERENTIAL/PLATELET
Absolute Monocytes: 456 cells/uL (ref 200–950)
Basophils Absolute: 30 cells/uL (ref 0–200)
Basophils Relative: 0.5 %
Eosinophils Absolute: 30 cells/uL (ref 15–500)
Eosinophils Relative: 0.5 %
HCT: 42.3 % (ref 38.5–50.0)
Hemoglobin: 14.8 g/dL (ref 13.2–17.1)
Lymphs Abs: 1296 cells/uL (ref 850–3900)
MCH: 30.8 pg (ref 27.0–33.0)
MCHC: 35 g/dL (ref 32.0–36.0)
MCV: 88.1 fL (ref 80.0–100.0)
MPV: 11 fL (ref 7.5–12.5)
Monocytes Relative: 7.6 %
Neutro Abs: 4188 cells/uL (ref 1500–7800)
Neutrophils Relative %: 69.8 %
Platelets: 263 10*3/uL (ref 140–400)
RBC: 4.8 10*6/uL (ref 4.20–5.80)
RDW: 11.8 % (ref 11.0–15.0)
Total Lymphocyte: 21.6 %
WBC: 6 10*3/uL (ref 3.8–10.8)

## 2021-09-18 LAB — HIV RNA, RTPCR W/R GT (RTI, PI,INT)
HIV 1 RNA Quant: 3910 copies/mL — ABNORMAL HIGH
HIV-1 RNA Quant, Log: 3.59 Log copies/mL — ABNORMAL HIGH

## 2021-09-18 LAB — HLA B*5701: HLA-B*5701 w/rflx HLA-B High: NEGATIVE

## 2021-09-18 LAB — HIV-1 INTEGRASE GENOTYPE

## 2021-09-18 LAB — COMPREHENSIVE METABOLIC PANEL
AG Ratio: 1.4 (calc) (ref 1.0–2.5)
ALT: 19 U/L (ref 9–46)
AST: 22 U/L (ref 10–40)
Albumin: 4.6 g/dL (ref 3.6–5.1)
Alkaline phosphatase (APISO): 70 U/L (ref 36–130)
BUN: 16 mg/dL (ref 7–25)
CO2: 27 mmol/L (ref 20–32)
Calcium: 9.6 mg/dL (ref 8.6–10.3)
Chloride: 106 mmol/L (ref 98–110)
Creat: 0.97 mg/dL (ref 0.60–1.24)
Globulin: 3.3 g/dL (calc) (ref 1.9–3.7)
Glucose, Bld: 79 mg/dL (ref 65–99)
Potassium: 4.4 mmol/L (ref 3.5–5.3)
Sodium: 139 mmol/L (ref 135–146)
Total Bilirubin: 1.2 mg/dL (ref 0.2–1.2)
Total Protein: 7.9 g/dL (ref 6.1–8.1)

## 2021-09-18 LAB — RPR: RPR Ser Ql: NONREACTIVE

## 2021-09-18 LAB — LIPID PANEL
Cholesterol: 134 mg/dL (ref <200)
HDL: 36 mg/dL — ABNORMAL LOW (ref >=40)
LDL Cholesterol (Calc): 83 mg/dL (calc)
Non-HDL Cholesterol (Calc): 98 mg/dL (calc) (ref <130)
Total CHOL/HDL Ratio: 3.7 (calc) (ref <5.0)
Triglycerides: 70 mg/dL (ref <150)

## 2021-09-18 LAB — HIV-1 GENOTYPE: HIV-1 Genotype: DETECTED — AB

## 2021-09-22 ENCOUNTER — Ambulatory Visit: Payer: Medicaid Other | Admitting: Family

## 2021-10-03 ENCOUNTER — Ambulatory Visit: Payer: Medicaid Other | Admitting: Family

## 2021-10-07 ENCOUNTER — Telehealth: Payer: Self-pay

## 2021-10-07 NOTE — Telephone Encounter (Signed)
Attempted to contact patient due to recent no shows. Patient may benefit from working with Paramedic as he has in the past to assist with adherence and keeping appointments. Will contact patient via Mychart to try reach him for scheduling.  Valarie Cones

## 2021-10-20 ENCOUNTER — Ambulatory Visit: Payer: Medicaid Other | Admitting: Family

## 2021-10-20 ENCOUNTER — Other Ambulatory Visit (HOSPITAL_COMMUNITY): Payer: Self-pay

## 2021-10-20 ENCOUNTER — Telehealth: Payer: Self-pay

## 2021-10-20 NOTE — Telephone Encounter (Signed)
Unable to reach to discuss no show 10/20/21 with Timothy Wall.

## 2021-10-25 NOTE — Telephone Encounter (Signed)
Staff able to connect with patient to assist with scheduling follow up visit with Pharmacy. Patient has no showed last 3 visits with Marcos Eke, NP.  Valarie Cones

## 2021-10-31 ENCOUNTER — Telehealth: Payer: Self-pay

## 2021-10-31 ENCOUNTER — Other Ambulatory Visit (HOSPITAL_COMMUNITY): Payer: Self-pay

## 2021-10-31 NOTE — Telephone Encounter (Signed)
[facility] Patient [facility] Encounter  Timothy Wall is covered under patient pharmacy plan.  Prescription can be filled at [facility].  Patient will have a $4.00 for each medication.  [name], CPhT Specialty Pharmacy Patient [facility] for Infectious Disease Phone: [phone] Fax:  [phone]

## 2021-11-03 ENCOUNTER — Ambulatory Visit: Payer: Medicaid Other | Admitting: Pharmacist

## 2021-11-08 ENCOUNTER — Ambulatory Visit: Payer: Medicaid Other | Admitting: Pharmacist

## 2021-11-18 ENCOUNTER — Telehealth: Payer: Self-pay

## 2021-11-18 ENCOUNTER — Other Ambulatory Visit: Payer: Self-pay | Admitting: Family

## 2021-11-18 DIAGNOSIS — B20 Human immunodeficiency virus [HIV] disease: Secondary | ICD-10-CM

## 2021-11-18 NOTE — Telephone Encounter (Signed)
Called patient to schedule appointment, call could not be completed.   Per Marcos Eke, NP no further refills until patient is seen as he likely has resistance to Pifeltro.   Sandie Ano, RN

## 2021-11-18 NOTE — Telephone Encounter (Signed)
Okay to refill Pifeltro? Saw your note mentioned some possible doravirine resistance - thanks!

## 2021-11-22 ENCOUNTER — Other Ambulatory Visit (HOSPITAL_COMMUNITY): Payer: Self-pay

## 2021-11-22 ENCOUNTER — Telehealth: Payer: Self-pay | Admitting: Pharmacist

## 2021-11-22 ENCOUNTER — Encounter: Payer: Self-pay | Admitting: Family

## 2021-11-22 ENCOUNTER — Ambulatory Visit (INDEPENDENT_AMBULATORY_CARE_PROVIDER_SITE_OTHER): Payer: Medicaid Other | Admitting: Family

## 2021-11-22 ENCOUNTER — Other Ambulatory Visit: Payer: Self-pay

## 2021-11-22 VITALS — BP 126/78 | HR 51 | Temp 97.7°F | Ht 69.0 in | Wt 147.0 lb

## 2021-11-22 DIAGNOSIS — Z0000 Encounter for general adult medical examination without abnormal findings: Secondary | ICD-10-CM

## 2021-11-22 DIAGNOSIS — B181 Chronic viral hepatitis B without delta-agent: Secondary | ICD-10-CM

## 2021-11-22 DIAGNOSIS — B20 Human immunodeficiency virus [HIV] disease: Secondary | ICD-10-CM

## 2021-11-22 MED ORDER — BIKTARVY 50-200-25 MG PO TABS: 1.0000 | ORAL_TABLET | Freq: Every day | ORAL | 5 refills | Status: DC

## 2021-11-22 MED ORDER — LENACAPAVIR SODIUM 4 X 300 MG PO TBPK
2.0000 | ORAL_TABLET | ORAL | 0 refills | Status: DC
  Filled 2021-11-22: qty 4, fill #0
  Filled 2021-11-23: qty 4, 15d supply, fill #0

## 2021-11-22 MED ORDER — SUNLENCA 463.5 MG/1.5ML ~~LOC~~ SOLN
927.0000 mg | SUBCUTANEOUS | 2 refills | Status: DC
  Filled 2021-11-22: qty 3, 180d supply, fill #0
  Filled 2021-11-23: qty 3, 90d supply, fill #0
  Filled 2022-05-12: qty 3, 30d supply, fill #1

## 2021-11-22 NOTE — Assessment & Plan Note (Signed)
No signs/symptoms of flair. Emphasized importance of taking medication on a regular basis. Previously well controlled. Check lab work today. Continue current dose of Biktarvy. Due for [place] at next office visit.

## 2021-11-22 NOTE — Assessment & Plan Note (Signed)
Timothy Wall has less than optimal adherence and good tolerance to Biktarvy and Pifeltro when he takes it which he misses more often than not.  Discussed importance of taking medication on a daily basis to reduce risk of progression of disease, complications in the future, and continued development of resistance.  After long discussion it was agreed to continue current dose of Biktarvy.  Stop Pifeltro. Start [employer]. He will continue with Biktarvy and Pilfeltro until first dose of Lenacaprivir. Check lab work today. Plan for follow up pending first injection.

## 2021-11-22 NOTE — Patient Instructions (Signed)
Nice to see you.  We will check your lab work today.  Continue to take your medication daily as prescribed.  Follow up pending availability of your new medication.   Have a great day and stay safe!

## 2021-11-22 NOTE — Assessment & Plan Note (Signed)
Discussed importance of safe sexual practice and condom use. Condoms and STD testing offered.  

## 2021-11-22 NOTE — Telephone Encounter (Signed)
Patient presented to clinic today for HIV follow-up after multiple missed appointments. He is currently taking Biktarvy and Pifeltro with low-level doravirine resistance. Tammy Sours presented two options for patient; either switching Pifletro to [place] twice daily or Sunlenca injections. Patient would like to start Sunlenca while continuing Biktarvy.  Counseled patient that he will need to complete an oral loading dose of Sunlenca the day of his first injection. Counseled that patient will take two Sunlenca 300 mg tablets (600 mg total) orally the first day and will take two Sunlenca 300 mg tablets (600 mg total) orally the following day with his evening medications regardless of food. Counseled patient that Ginette Pitman is two separate subcutaneous injections in the abdomen every 6 months.    Medication approved through his insurance; prescriptions sent to [facility]. Once delivery is set up, will schedule first injection appointment.  Margarite Gouge, PharmD, CPP, BCIDP Clinical Pharmacist Practitioner Infectious Diseases Clinical Pharmacist [facility] for Infectious Disease

## 2021-11-22 NOTE — Progress Notes (Signed)
Brief Narrative   Patient ID: Timothy Wall, male    DOB: Dec 11, 1995, 26 y.o.   MRN: [id number]  Mr. Timothy Wall is a 26 y/o male with HIV disease diagnosed perinatally with both parents HIV positive. Initial CD4 count, viral load, and genotype are unavailable. 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   Subjective:    Chief Complaint  Patient presents with   Follow-up    HPI:  Timothy Wall is a 26 y.o. male with perinatal intal HIV disease and chronic hepatitis B last seen on 08/25/2021 with poorly controlled virus and less than optimal adherence to his ART regimen of Biktarvy and Pifeltro.  Viral load was 3910 with CD4 count of 268.  Kidney function, liver function, electrolytes within normal ranges.  Has significant resistance to multiple medications. Hepatitis B DNA was last undetectable and E antibody positive . Has missed several office visits since that time.  Here today for follow-up.  Mr. Timothy Wall has been busy since his last office visit and has had less than optimal adherence with good tolerance to his ART regimen of Biktarvy and Pifeltro.  Has been on and off medication.  Given his work schedule he forgets to take his medication often.  Has to take his medication with soda or juice due to the taste of medication with water.  Expresses frustration regarding his perinatal diagnosis and having to take medications on a daily basis.  Feeling well today with no new concerns/complaints. Denies fevers, chills, night sweats,  headaches, changes in vision, neck pain/stiffness, nausea, diarrhea, vomiting, lesions or rashes.   No Known Allergies    Outpatient Medications Prior to Visit  Medication Sig Dispense Refill   PIFELTRO 100 MG TABS tablet Take 1 tablet (100 mg total) by mouth daily. 30 tablet 2   BIKTARVY 50-200-25 MG TABS tablet Take 1 tablet by mouth daily. 30 tablet 2   No facility-administered medications prior to visit.     Past Medical History:  Diagnosis Date   Hepatitis B    HIV (human immunodeficiency virus infection) (HCC)      Past Surgical History:  Procedure Laterality Date   EYE SURGERY        Review of Systems  Constitutional:  Negative for appetite change, chills, fatigue, fever and unexpected weight change.  Eyes:  Negative for visual disturbance.  Respiratory:  Negative for cough, chest tightness, shortness of breath and wheezing.   Cardiovascular:  Negative for chest pain and leg swelling.  Gastrointestinal:  Negative for abdominal pain, constipation, diarrhea, nausea and vomiting.  Genitourinary:  Negative for dysuria, flank pain, frequency, genital sores, hematuria and urgency.  Skin:  Negative for rash.  Allergic/Immunologic: Negative for immunocompromised state.  Neurological:  Negative for dizziness and headaches.      Objective:    BP 126/78   Pulse (!) 51   Temp 97.7 F (36.5 C) (Oral)   Ht 5\' 9"  (1.753 m)   Wt 147 lb (66.7 kg)   BMI 21.71 kg/m  Nursing note and vital signs reviewed.  Physical Exam Constitutional:      General: He is not in acute distress.  Appearance: He is well-developed.  Eyes:     Conjunctiva/sclera: Conjunctivae normal.  Cardiovascular:     Rate and Rhythm: Normal rate and regular rhythm.     Heart sounds: Normal heart sounds. No murmur heard.    No friction rub. No gallop.  Pulmonary:     Effort: Pulmonary effort is normal. No respiratory distress.     Breath sounds: Normal breath sounds. No wheezing or rales.   Chest:     Chest wall: No tenderness.  Abdominal:     General: Bowel sounds are normal.     Palpations: Abdomen is soft.     Tenderness: There is no abdominal tenderness.  Musculoskeletal:     Cervical back: Neck supple.  Lymphadenopathy:     Cervical: No cervical adenopathy.  Skin:    General: Skin is warm and dry.     Findings: No rash.  Neurological:     Mental Status: He is alert and oriented to person, place, and time.  Psychiatric:        Behavior: Behavior normal.        Thought Content: Thought content normal.        Judgment: Judgment normal.         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) 02/40/9735   Systolic hypertension 32/99/2426   Blindness of left eye 10/02/2012     Problem List Items Addressed This Visit       Digestive   Chronic hepatitis B ([place])    No signs/symptoms of flair. Emphasized importance of taking medication on a regular basis. Previously well controlled. Check lab work today. Continue current dose of Biktarvy. Due for [place] at next office visit.       Relevant Medications   BIKTARVY 50-200-25 MG TABS tablet   SQ injection lenacapavir  (SUNLENCA) 463.5 MG/1.5ML SQ injection   lenacapavir (SUNLENCA) 300 mg tablet   Other Relevant Orders   Hepatitis B DNA, ultraquantitative, PCR   Hepatitis B e antibody   Hepatitis B e antigen   Liver Fibrosis, FibroTest-ActiTest     Other   Human immunodeficiency virus (HIV) disease (HCC) - Primary (Chronic)    Mr. Want has less than optimal adherence and good tolerance to Biktarvy and Pifeltro when he takes it which he misses more often than not.  Discussed importance of taking medication on a daily basis to reduce risk of progression of disease, complications in the future, and continued development of resistance.  After long discussion it was agreed to continue current dose of Biktarvy.  Stop Pifeltro. Start [employer]. He will continue with Biktarvy and Pilfeltro until  first dose of Lenacaprivir. Check lab work today. Plan for follow up pending first injection.       Relevant Medications   BIKTARVY 50-200-25 MG TABS tablet   SQ injection lenacapavir (SUNLENCA) 463.5 MG/1.5ML SQ injection   lenacapavir (SUNLENCA) 300 mg tablet   Other Relevant Orders   Comprehensive metabolic panel   HIV RNA, RTPCR W/R GT (RTI, PI,INT)   T-helper cell (CD4)- ([facility] clinic only)   Healthcare maintenance    Discussed importance of safe sexual practice and condom use. Condoms and STD testing offered.         I am having Esmond Harps start on [place] and lenacapavir. I am also having him maintain his Pifeltro and Biktarvy.   Meds ordered this encounter  Medications   BIKTARVY 50-200-25 MG TABS tablet    Sig: Take 1 tablet by mouth daily.    Dispense:  30 tablet    Refill:  5    Order Specific Question:   Supervising Provider    Answer:   Baxter Flattery, CYNTHIA [[id number]]   SQ injection lenacapavir (SUNLENCA) 463.5 MG/1.5ML SQ injection    Sig: Inject 3 mLs (927 mg total) into the skin every 6 (six) months. Administer each injection subcutaneously at separate sites in the abdomen (more or equal to  2 inches from the navel).    Dispense:  3 mL    Refill:  2    Order Specific Question:   Supervising Provider    Answer:   Thayer Headings [[id number]]   lenacapavir (SUNLENCA) 300 mg tablet    Sig: Take 2 tablets by mouth See admin instructions. This is the 2-day initiation kit: Take two 300-mg tablets on Day 1 & Day 2 with or without food.    Dispense:  4 tablet    Refill:  0    Order Specific Question:   Supervising Provider    Answer:   Thayer Headings [[id number]]     Follow-up: Pending lab work and first injection of lenacapavir.    Terri Piedra, MSN, FNP-C Nurse Practitioner [facility] for Infectious Disease [place] number: [phone]

## 2021-11-23 ENCOUNTER — Other Ambulatory Visit (HOSPITAL_COMMUNITY): Payer: Self-pay

## 2021-11-23 LAB — T-HELPER CELL (CD4) - (RCID CLINIC ONLY)
CD4 % Helper T Cell: 24 % — ABNORMAL LOW (ref 33–65)
CD4 T Cell Abs: 401 /uL (ref 400–1790)

## 2021-11-24 ENCOUNTER — Other Ambulatory Visit (HOSPITAL_COMMUNITY): Payer: Self-pay

## 2021-11-28 ENCOUNTER — Telehealth: Payer: Self-pay

## 2021-11-28 LAB — COMPREHENSIVE METABOLIC PANEL
AG Ratio: 1.4 (calc) (ref 1.0–2.5)
ALT: 30 U/L (ref 9–46)
AST: 32 U/L (ref 10–40)
Albumin: 4.6 g/dL (ref 3.6–5.1)
Alkaline phosphatase (APISO): 73 U/L (ref 36–130)
BUN: 15 mg/dL (ref 7–25)
CO2: 26 mmol/L (ref 20–32)
Calcium: 9.7 mg/dL (ref 8.6–10.3)
Chloride: 105 mmol/L (ref 98–110)
Creat: 0.99 mg/dL (ref 0.60–1.24)
Globulin: 3.2 g/dL (calc) (ref 1.9–3.7)
Glucose, Bld: 90 mg/dL (ref 65–99)
Potassium: 4.3 mmol/L (ref 3.5–5.3)
Sodium: 138 mmol/L (ref 135–146)
Total Bilirubin: 0.9 mg/dL (ref 0.2–1.2)
Total Protein: 7.8 g/dL (ref 6.1–8.1)

## 2021-11-28 LAB — LIVER FIBROSIS, FIBROTEST-ACTITEST
ALT: 27 U/L (ref 9–46)
Alpha-2-Macroglobulin: 201 mg/dL (ref 106–279)
Apolipoprotein A1: 135 mg/dL (ref 94–176)
Bilirubin: 0.8 mg/dL (ref 0.2–1.2)
Fibrosis Score: 0.19
GGT: 21 U/L (ref 3–70)
Haptoglobin: 109 mg/dL (ref 43–212)
Necroinflammat ACT Score: 0.11
Reference ID: 4549685

## 2021-11-28 LAB — HEPATITIS B E ANTIGEN: Hep B E Ag: NONREACTIVE

## 2021-11-28 LAB — HEPATITIS B DNA, ULTRAQUANTITATIVE, PCR
Hepatitis B DNA: 22 IU/mL — ABNORMAL HIGH
Hepatitis B virus DNA: 1.34 Log IU/mL — ABNORMAL HIGH

## 2021-11-28 LAB — HIV RNA, RTPCR W/R GT (RTI, PI,INT)
HIV 1 RNA Quant: 29 copies/mL — ABNORMAL HIGH
HIV-1 RNA Quant, Log: 1.46 Log copies/mL — ABNORMAL HIGH

## 2021-11-28 LAB — HEPATITIS B E ANTIBODY: Hep B E Ab: REACTIVE — AB

## 2021-11-28 NOTE — Telephone Encounter (Signed)
[facility] Patient Advocate Encounter  Patient's medications have been couriered to [facility] from [place] and will be picked up & injected on the patient next office visit.  Sunlenca Tablets & Injectiion.  Ileene Patrick , [place] [facility]acy Patient [facility] for Infectious Disease Phone: [phone] Fax:  [phone]

## 2021-11-29 ENCOUNTER — Ambulatory Visit (INDEPENDENT_AMBULATORY_CARE_PROVIDER_SITE_OTHER): Payer: Medicaid Other | Admitting: Pharmacist

## 2021-11-29 ENCOUNTER — Other Ambulatory Visit: Payer: Self-pay

## 2021-11-29 DIAGNOSIS — B20 Human immunodeficiency virus [HIV] disease: Secondary | ICD-10-CM

## 2021-11-29 MED ORDER — LENACAPAVIR SODIUM 463.5 MG/1.5ML ~~LOC~~ SOLN
927.0000 mg | Freq: Once | SUBCUTANEOUS | Status: AC
  Administered 2021-11-29: 927 mg via SUBCUTANEOUS

## 2021-11-29 NOTE — Progress Notes (Signed)
11/29/2021  HPI: Timothy Wall is a 26 y.o. male who presents to the [place] clinic for HIV follow-up.  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   LENACAPAVIR (SUNLENCA) 300 MG TABLET    Take 2 tablets by mouth See admin instructions. This is the 2-day initiation kit: Take two 300-mg tablets on Day 1 & Day 2 with or without food.   PIFELTRO 100 MG TABS TABLET    Take 1 tablet (100 mg total) by mouth daily.   SQ INJECTION LENACAPAVIR (SUNLENCA) 463.5 MG/1.5ML SQ INJECTION    Inject 3 mLs (927 mg total) into the skin every 6 (six) months. Administer each injection subcutaneously at separate sites in the abdomen (more or equal to 2 inches from the navel).  Modified Medications   No medications on file  Discontinued Medications   No medications on file    Allergies: No Known Allergies  Past Medical History: Past Medical History:  Diagnosis Date   Hepatitis B    HIV (human immunodeficiency virus infection) ([place])     Social History: Social History   Socioeconomic History   Marital status: Single    Spouse name: Not on file   Number of children: Not on file   Years of education: Not on file    Highest education level: Not on file  Occupational History   Not on file  Tobacco Use   Smoking status: Every Day    Packs/day: 0.25    Types: Cigarettes   Smokeless tobacco: Never   Tobacco comments:    Former cigarette smoker    vape  Scientific laboratory technician Use: Never used  Substance and Sexual Activity   Alcohol use: Yes   Drug use: No   Sexual activity: Not Currently    Partners: Female    Comment: given condoms  Other Topics Concern   Not on file  Social History Narrative   Not on file   Social Determinants of Health   Financial Resource Strain: Not on file  Food Insecurity: Not on file  Transportation Needs: Not on file  Physical Activity: Not on file  Stress: Not on file  Social Connections: Not on file    Labs: Lab Results  Component Value Date   HIV1RNAQUANT 29 (H) 11/22/2021   HIV1RNAQUANT 3,910 (H) 08/25/2021   HIV1RNAQUANT <20 06/30/2013   CD4TABS 401 11/22/2021   CD4TABS 268 (L) 08/25/2021   CD4TABS 380 (L) 06/30/2013    RPR and STI Lab Results  Component Value Date   LABRPR NON-REACTIVE 08/25/2021   LABRPR NON REAC 10/22/2012        No data to display          Hepatitis B Lab Results  Component Value Date   HEPBSAB  NEG 10/22/2012   HEPBSAG POSITIVE (A) 10/22/2012   HEPBCAB POS (A) 10/22/2012   Hepatitis C No results found for: "HEPCAB", "HCVRNAPCRQN" Hepatitis A Lab Results  Component Value Date   HAV NEG 10/22/2012   Lipids: Lab Results  Component Value Date   CHOL 134 08/25/2021   TRIG 70 08/25/2021   HDL 36 (L) 08/25/2021   CHOLHDL 3.7 08/25/2021   VLDL 18 10/22/2012   LDLCALC 83 08/25/2021    Current HIV Regimen: Biktarvy, Pifeltro   Assessment: Pt presents to clinic for initiation of Sunlenca injection for MDR HIV.Patient has low level resistance to Pifeltro requiring medication change.  Patient also notes challenges with swallowing pills and prefers Q6 month injections compared to Rukobia, which was an  alternative option presented to the patient. Patient further stated he would be more adherent with injections and a once daily medication.Patient took 2 tablets in clinic for the first part of his load and advised to take 2 tablets tomorrow (11/30/21) to complete the load. Counseled the main side effects of Sunlenca include injection site reactions and nausea which generally improves with subsequent injections. He voiced understanding.   Administered lenacapavir 463.5/1.5mL subcutaneously in upper left abdomen. Administered lenacapavir 463.5/1.5mL subcutaneously in lower left abdomen more than two inches away from first injection site. Monitored patient for 10 minutes after injection. Injections were tolerated well without issue. Will need a follow up appointment for injections in 6 months.  Plan: Sunlenca injection x2 administered in clinic, Sunlenca tablets x2 administered in clinic  Complete load with Sulenca tablets x2 11/30/21  STOP Pifeltro  CONTINUE Biktarvy  Call with questions and concerns  F/U with Greg Calone, NP 03/14/2022  Next injection due 05/30/2022 (scheduled)   ADDENDUM: Patient called after leaving clinic and stated some medication was leaking out of his injection site. Not noted to be bloody. Patient advised to let us know if there was significant medication loss from the injection site.   Austin Paytes, PharmD PGY-2 Infectious Diseases Resident  11/29/2021 3:27 PM     

## 2022-01-10 ENCOUNTER — Other Ambulatory Visit (HOSPITAL_COMMUNITY): Payer: Self-pay

## 2022-01-11 ENCOUNTER — Other Ambulatory Visit (HOSPITAL_COMMUNITY): Payer: Self-pay

## 2022-02-06 ENCOUNTER — Other Ambulatory Visit: Payer: Self-pay | Admitting: Family

## 2022-02-06 ENCOUNTER — Other Ambulatory Visit (HOSPITAL_COMMUNITY): Payer: Self-pay

## 2022-02-06 DIAGNOSIS — B20 Human immunodeficiency virus [HIV] disease: Secondary | ICD-10-CM

## 2022-02-09 ENCOUNTER — Other Ambulatory Visit (HOSPITAL_COMMUNITY): Payer: Self-pay

## 2022-03-14 ENCOUNTER — Ambulatory Visit (INDEPENDENT_AMBULATORY_CARE_PROVIDER_SITE_OTHER): Payer: Medicaid Other | Admitting: Family

## 2022-03-14 ENCOUNTER — Encounter: Payer: Self-pay | Admitting: Family

## 2022-03-14 ENCOUNTER — Other Ambulatory Visit: Payer: Self-pay

## 2022-03-14 VITALS — BP 136/85 | HR 74 | Temp 98.1°F | Resp 16 | Ht 69.0 in | Wt 147.2 lb

## 2022-03-14 DIAGNOSIS — M79674 Pain in right toe(s): Secondary | ICD-10-CM | POA: Diagnosis not present

## 2022-03-14 DIAGNOSIS — B181 Chronic viral hepatitis B without delta-agent: Secondary | ICD-10-CM | POA: Diagnosis not present

## 2022-03-14 DIAGNOSIS — Z23 Encounter for immunization: Secondary | ICD-10-CM | POA: Diagnosis not present

## 2022-03-14 DIAGNOSIS — Z0000 Encounter for general adult medical examination without abnormal findings: Secondary | ICD-10-CM

## 2022-03-14 DIAGNOSIS — B20 Human immunodeficiency virus [HIV] disease: Secondary | ICD-10-CM | POA: Diagnosis present

## 2022-03-14 MED ORDER — CLOTRIMAZOLE 1 % EX CREA: 1.0000 | TOPICAL_CREAM | Freq: Two times a day (BID) | CUTANEOUS | 0 refills | Status: DC

## 2022-03-14 MED ORDER — BIKTARVY 50-200-25 MG PO TABS: 1.0000 | ORAL_TABLET | Freq: Every day | ORAL | 5 refills | Status: DC

## 2022-03-14 NOTE — Assessment & Plan Note (Signed)
Acute on chronic right toe pain follow injury kicking a basketball barefooted in 2016 with continued pain depending upon activity. Encouraged conservative treatment with ice after activity as needed. Will refer to Orthopedics for further evaluation to determine more definitive treatment if available.

## 2022-03-14 NOTE — Progress Notes (Signed)
Brief Narrative   Patient ID: Timothy Wall, male    DOB: [birth year], 27 y.o.   MRN: [id number]  Mr. 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 Initial CD4 count, viral load, and genotype are unavailable. 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    Subjective:    Chief Complaint  Patient presents with   Follow-up    B20 -     HPI:  Timothy Wall is a 27 y.o. male with HIV disease and chronic Hepatitis B last seen on 11/22/21 with well controlled virus despite less than optimal adherence to [place] and Pifeltro with viral load undetectable and CD4 count 401. Hepatitis B DNA was 22. Renal function, hepatic function and electrolytes were normal. Started on Lenicapravir and continued on Biktarvy. Here today for follow up.  [name] has been doing well since his last office visit and has been taking his Biktarvy as prescribed with no adverse side effects or problems obtaining medication from the pharmacy. Did have a small amount of drainage from previous injection which stopped following pressure. Has a red rash located on his inner thigh that has been going on for several weeks. Wears boxers and works in a hot environment and tends to sweat. No treatment to date. Also has concern regarding his right great toe. This was injured when he was younger kicking a basketball with his barefoot. Continues to have pain that waxes and wanes that depends on activity. Feels like he can  "take my toe out of place and put it back it." Was previously seen by podiatry several years ago with no interventions and requesting referral for evaluation and this continues to cause pain. Condoms and STD testing offered. Due for influenza vaccination. Working full time with [employer] and [employer].   Denies fevers, chills, night sweats, headaches, changes in vision, neck pain/stiffness, nausea, diarrhea, vomiting, or lesions.  No Known Allergies    Outpatient Medications Prior to Visit  Medication Sig Dispense Refill   SQ injection lenacapavir (SUNLENCA) 463.5 MG/1.5ML SQ injection Inject 3 mLs (927 mg total) into the skin every 6 (six) months. Administer each injection subcutaneously at separate sites in the abdomen (more or equal to 2 inches from the navel). 3 mL 2   BIKTARVY 50-200-25 MG TABS tablet Take 1 tablet by mouth daily. 30 tablet 5   lenacapavir (SUNLENCA) 300 mg tablet Take 2 tablets by mouth See admin instructions. This is the 2-day initiation kit: Take two 300-mg tablets on Day 1 & Day 2 with or without food. (Patient not taking: Reported on 03/14/2022) 4 tablet 0   No facility-administered medications prior to visit.     Past Medical History:  Diagnosis Date   Hepatitis B    HIV (human immunodeficiency virus infection) ([place])      Past Surgical History:  Procedure Laterality Date   EYE SURGERY        Review of Systems  Constitutional:  Negative for appetite change, chills, fatigue, fever and unexpected weight change.  Eyes:  Negative for visual disturbance.  Respiratory:  Negative for cough, chest tightness,  shortness of breath and wheezing.   Cardiovascular:  Negative for chest pain and leg swelling.  Gastrointestinal:  Negative for abdominal pain, constipation, diarrhea, nausea and vomiting.  Genitourinary:  Negative for dysuria, flank pain, frequency, genital sores, hematuria and urgency.  Musculoskeletal:        Right great toe pain  Skin:  Positive for rash.   Allergic/Immunologic: Negative for immunocompromised state.  Neurological:  Negative for dizziness and headaches.      Objective:    BP 136/85   Pulse 74   Temp 98.1 F (36.7 C) (Oral)   Resp 16   Ht _0  (1.753 m)   Wt 147 lb 3.2 oz (66.8 kg)   SpO2 98%   BMI 21.74 kg/m  Nursing note and vital signs reviewed.  Physical Exam Constitutional:      General: He is not in acute distress.    Appearance: He is well-developed.  Cardiovascular:     Rate and Rhythm: Normal rate and regular rhythm.     Heart sounds: Normal heart sounds.  Pulmonary:     Effort: Pulmonary effort is normal.     Breath sounds: Normal breath sounds.  Skin:    General: Skin is warm and dry.  Neurological:     Mental Status: He is alert and oriented to person, place, and time.  Psychiatric:        Mood and Affect: Mood normal.         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 Check lab work today. Will need imaging to screen for [facility] at next office visit.       Relevant Medications   clotrimazole (CLOTRIMAZOLE AF) 1 % cream   BIKTARVY 50-200-25 MG TABS tablet   Other Relevant Orders   Hepatitis B DNA, ultraquantitative, PCR   Liver Fibrosis, FibroTest-ActiTest   Hepatitis B surface antigen     Other   Human immunodeficiency virus (HIV) disease ([place]) - Primary (Chronic)    Jerric has been doing exceptionally well and has good adherence and tolerance to [place]. Reviewed previous lab work and discussed plan of care. Unfortunately not a candidate for Cabenuva secondary to rilpivirine resistance. Check lab work today. Continue current dose of Biktarvy and Lenacapavir.  Plan for follow up in 6 months or sooner if needed with lab work on the same day.       Relevant Medications   clotrimazole (CLOTRIMAZOLE AF) 1 % cream   BIKTARVY 50-200-25 MG TABS tablet   Other Relevant Orders   BASIC METABOLIC PANEL WITH GFR   HIV-1 RNA quant-no reflex-bld   T-helper cell (CD4)- ([facility] clinic only)   Healthcare maintenance    Discussed importance of safe sexual practice and condom use. Condoms and STD testing offered.  Influenza updated today.       Great toe pain, right    Acute on chronic right toe pain follow injury kicking a basketball barefooted in 2016 with continued pain depending upon activity. Encouraged conservative treatment with ice after activity as needed. Will refer to Orthopedics for further evaluation to determine more definitive treatment if available.       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 Additionally, I am having him maintain his S[place]unei Dar[place]and BBoeing   Meds ordered this encounter  Medications   clotrimazole (CLOTRIMAZOLE AF) 1 % cream    Sig: Apply 1 Application topically 2 (two) times daily.    Dispense:  30 g    Refill:  0    Order Specific Question:   Supervising Provider    Answer:   SNIDER, CYNTHIA [[id number]]   BIKTARVY 50-200-25 MG TABS tablet    Sig: Take 1 tablet by mouth daily.    Dispense:  30 tablet    Refill:  5    Order Specific Question:   Supervising Provider    Answer:   SCarlyle Basques[[id number]]     Follow-up: Return in about 6 months (around 09/12/2022), or if symptoms worsen or fail to improve.   GTerri Piedra MSN, FNP-C Nurse Practitioner [facility] CAlderpointnumber: [phone]

## 2022-03-14 NOTE — Assessment & Plan Note (Signed)
Discussed importance of safe sexual practice and condom use. Condoms and STD testing offered.  Influenza updated today.  

## 2022-03-14 NOTE — Assessment & Plan Note (Signed)
Timothy Wall's Hepatitis B is well controlled with current dose of Biktarvy and no signs/episodes of flare. Check lab work today. Will need imaging to screen for [facility] at next office visit.

## 2022-03-14 NOTE — Patient Instructions (Addendum)
Nice to see you. ? ?We will check your lab work today. ? ?Continue to take your medication daily as prescribed. ? ?Refills have been sent to the pharmacy. ? ?Plan for follow up in 6 months or sooner if needed with lab work on the same day. ? ?Have a great day and stay safe! ? ?

## 2022-03-14 NOTE — Assessment & Plan Note (Signed)
Timothy Wall has been doing exceptionally well and has good adherence and tolerance to [place] and Lenicapavir. Reviewed previous lab work and discussed plan of care. Unfortunately not a candidate for Cabenuva secondary to rilpivirine resistance. Check lab work today. Continue current dose of Biktarvy and Lenacapavir. Plan for follow up in 6 months or sooner if needed with lab work on the same day.

## 2022-03-15 LAB — T-HELPER CELL (CD4) - (RCID CLINIC ONLY)
CD4 % Helper T Cell: 30 % — ABNORMAL LOW (ref 33–65)
CD4 T Cell Abs: 303 /uL — ABNORMAL LOW (ref 400–1790)

## 2022-03-17 LAB — BASIC METABOLIC PANEL WITH GFR
BUN: 18 mg/dL (ref 7–25)
CO2: 27 mmol/L (ref 20–32)
Calcium: 9.9 mg/dL (ref 8.6–10.3)
Chloride: 106 mmol/L (ref 98–110)
Creat: 1.07 mg/dL (ref 0.60–1.24)
Glucose, Bld: 93 mg/dL (ref 65–99)
Potassium: 4.3 mmol/L (ref 3.5–5.3)
Sodium: 140 mmol/L (ref 135–146)
eGFR: 97 mL/min/{1.73_m2} (ref >=60)

## 2022-03-17 LAB — LIVER FIBROSIS, FIBROTEST-ACTITEST
ALT: 23 U/L (ref 9–46)
Alpha-2-Macroglobulin: 190 mg/dL (ref 106–279)
Apolipoprotein A1: 133 mg/dL (ref 94–176)
Bilirubin: 1 mg/dL (ref 0.2–1.2)
Fibrosis Score: 0.21
GGT: 24 U/L (ref 3–70)
Haptoglobin: 118 mg/dL (ref 43–212)
Necroinflammat ACT Score: 0.09
Reference ID: 4708237

## 2022-03-17 LAB — HIV-1 RNA QUANT-NO REFLEX-BLD
HIV 1 RNA Quant: NOT DETECTED Copies/mL
HIV-1 RNA Quant, Log: NOT DETECTED Log cps/mL

## 2022-03-17 LAB — HEPATITIS B DNA, ULTRAQUANTITATIVE, PCR
Hepatitis B DNA: 545 IU/mL — ABNORMAL HIGH
Hepatitis B virus DNA: 2.74 Log IU/mL — ABNORMAL HIGH

## 2022-03-17 LAB — HEPATITIS B SURFACE ANTIGEN: Hepatitis B Surface Ag: REACTIVE — AB

## 2022-05-04 ENCOUNTER — Other Ambulatory Visit (HOSPITAL_COMMUNITY): Payer: Self-pay

## 2022-05-10 ENCOUNTER — Other Ambulatory Visit (HOSPITAL_COMMUNITY): Payer: Self-pay

## 2022-05-11 ENCOUNTER — Other Ambulatory Visit (HOSPITAL_COMMUNITY): Payer: Self-pay

## 2022-05-12 ENCOUNTER — Telehealth: Payer: Self-pay

## 2022-05-12 ENCOUNTER — Other Ambulatory Visit (HOSPITAL_COMMUNITY): Payer: Self-pay

## 2022-05-12 NOTE — Telephone Encounter (Signed)
[facility] Patient Advocate Encounter   Received notification from [facility] that prior authorization for [facility] is required.   PA submitted on 05/12/22 Key [id number] Status is pending    [facility] will continue to follow.   Ileene Patrick, [place] [facility] Patient [facility] for Infectious Disease Phone: [phone] Fax:  [phone]

## 2022-05-16 ENCOUNTER — Other Ambulatory Visit (HOSPITAL_COMMUNITY): Payer: Self-pay

## 2022-05-18 ENCOUNTER — Other Ambulatory Visit: Payer: Self-pay | Admitting: Family

## 2022-05-18 ENCOUNTER — Other Ambulatory Visit (HOSPITAL_COMMUNITY): Payer: Self-pay

## 2022-05-18 DIAGNOSIS — B20 Human immunodeficiency virus [HIV] disease: Secondary | ICD-10-CM

## 2022-05-18 MED ORDER — SUNLENCA 463.5 MG/1.5ML ~~LOC~~ SOLN: 927.0000 mg | SUBCUTANEOUS | 2 refills | Status: DC

## 2022-05-19 ENCOUNTER — Other Ambulatory Visit (HOSPITAL_COMMUNITY): Payer: Self-pay

## 2022-05-22 ENCOUNTER — Other Ambulatory Visit: Payer: Self-pay

## 2022-05-22 ENCOUNTER — Other Ambulatory Visit: Payer: Self-pay | Admitting: Pharmacist

## 2022-05-22 ENCOUNTER — Other Ambulatory Visit (HOSPITAL_COMMUNITY): Payer: Self-pay

## 2022-05-22 DIAGNOSIS — B20 Human immunodeficiency virus [HIV] disease: Secondary | ICD-10-CM

## 2022-05-22 MED ORDER — SUNLENCA 463.5 MG/1.5ML ~~LOC~~ SOLN
927.0000 mg | SUBCUTANEOUS | 2 refills | Status: DC
  Filled 2022-05-22: qty 3, 180d supply, fill #0
  Filled 2022-05-22: qty 3, 90d supply, fill #0
  Filled 2022-11-07: qty 3, 90d supply, fill #1

## 2022-05-23 ENCOUNTER — Other Ambulatory Visit: Payer: Self-pay

## 2022-05-23 ENCOUNTER — Other Ambulatory Visit (HOSPITAL_COMMUNITY): Payer: Self-pay

## 2022-05-25 ENCOUNTER — Telehealth: Payer: Self-pay

## 2022-05-25 NOTE — Telephone Encounter (Signed)
[facility] Patient Advocate Encounter  Patient's medication Timothy Wall) have been couriered to [facility] from [facility] and will be administered on the patient next office visit on 05/30/22.  Ileene Patrick , [place] [facility] Patient [facility] for Infectious Disease Phone: [phone] Fax:  [phone]

## 2022-05-30 ENCOUNTER — Other Ambulatory Visit: Payer: Self-pay

## 2022-05-30 ENCOUNTER — Ambulatory Visit: Payer: Medicaid Other | Admitting: Pharmacist

## 2022-05-30 DIAGNOSIS — B20 Human immunodeficiency virus [HIV] disease: Secondary | ICD-10-CM | POA: Diagnosis not present

## 2022-05-30 DIAGNOSIS — Z23 Encounter for immunization: Secondary | ICD-10-CM | POA: Diagnosis not present

## 2022-05-30 MED ORDER — LENACAPAVIR SODIUM 463.5 MG/1.5ML ~~LOC~~ SOLN
927.0000 mg | Freq: Once | SUBCUTANEOUS | Status: AC
  Administered 2022-05-30: 927 mg via SUBCUTANEOUS

## 2022-05-30 NOTE — Progress Notes (Signed)
HPI: Timothy Wall is a 27 y.o. male who presents to the [place] clinic for [facility] administration.  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   CLOTRIMAZOLE (CLOTRIMAZOLE AF) 1 % CREAM    Apply 1 Application topically 2 (two) times daily.   SQ INJECTION LENACAPAVIR (SUNLENCA) 463.5 MG/1.5ML SQ INJECTION    Inject 3 mLs (927 mg total) into the skin every 6 (six) months. Administer each injection subcutaneously at separate sites in the abdomen (more or equal to 2 inches from the navel).  Modified Medications   No medications on file  Discontinued Medications   No medications on file    Allergies: No Known Allergies  Past Medical History: Past Medical History:  Diagnosis Date   Hepatitis B    HIV (human immunodeficiency virus infection) ([place])     Social History: Social History   Socioeconomic History   Marital status: Single    Spouse name: Not on file   Number of children: Not on file   Years of education: Not on file   Highest education level: Not on file  Occupational History   Not on file  Tobacco Use   Smoking status: Former    Packs/day:  .25    Types: Cigarettes   Smokeless tobacco: Never   Tobacco comments:    Former cigarette smoker    vape  Scientific laboratory technician Use: Never used  Substance and Sexual Activity   Alcohol use: Yes   Drug use: No   Sexual activity: Not Currently    Partners: Female    Comment: declined condoms  Other Topics Concern   Not on file  Social History Narrative   Not on file   Social Determinants of Health   Financial Resource Strain: Not on file  Food Insecurity: Not on file  Transportation Needs: Not on file  Physical Activity: Not on file  Stress: Not on file  Social Connections: Not on file    Labs: Lab Results  Component Value Date   HIV1RNAQUANT Not Detected 03/14/2022   HIV1RNAQUANT 29 (H) 11/22/2021   HIV1RNAQUANT 3,910 (H) 08/25/2021   CD4TABS 303 (L) 03/14/2022   CD4TABS 401 11/22/2021   CD4TABS 268 (L) 08/25/2021    RPR and STI Lab Results  Component Value Date   LABRPR NON-REACTIVE 08/25/2021   LABRPR NON REAC 10/22/2012        No data to display          Hepatitis B Lab Results  Component Value Date   HEPBSAB NEG 10/22/2012   HEPBSAG REACTIVE (A) 03/14/2022   HEPBCAB POS (A) 10/22/2012   Hepatitis C No results found for: "HEPCAB", "HCVRNAPCRQN" Hepatitis A Lab Results  Component Value Date  HAV NEG 10/22/2012   Lipids: Lab Results  Component Value Date   CHOL 134 08/25/2021   TRIG 70 08/25/2021   HDL 36 (L) 08/25/2021   CHOLHDL 3.7 08/25/2021   VLDL 18 10/22/2012   LDLCALC 83 08/25/2021    Current HIV Regimen: Sunlenca + Biktarvy  TARGET DATE: The 19th of the month  Assessment: Timothy Wall presents today for second administration of Sunlenca injections. Will administer Sunlenca on the patient's right side of abdomen as it was last given on his left side. He is on Biktarvy as well and reports no missing doses since his last appointment. He politely declined STI testing. Asked patient if he would be interested in receiving the  meningococcal vaccine today as his last dose was in 2018. The patient was agreeable to receiving the meningococcal vaccine today but wished to defer the COVID vaccine for now.     Administered lenacapavir 463.5/1.1mL subcutaneously in upper right abdomen. Administered lenacapavir 463.5/1.14mL subcutaneously in lower right abdomen more than two inches away from first injection site. Monitored patient for 10 minutes after injection. Injections were tolerated well without issue. Will follow up with Timothy Wall on 09/19/22. Will need a follow up appointment for injections in 6 months.   Patient recently moved to [facility] and is in need of an infectious diseases clinic in that area. He stopped by the [facility] front desk following his appointment for suggestions as to a clinic in [place].    Plan: - Second Sunlenca injections given - Follow up with Timothy Wall on 09/19/22  - Follow up scheduling Sunlenca injections in 6 months pending transfer to new clinic in [facility] for any issues or questions    Billey Gosling, PharmD PGY1 Pharmacy Resident 3/19/20242:22 PM

## 2022-09-19 ENCOUNTER — Ambulatory Visit: Payer: Medicaid Other | Admitting: Family

## 2022-09-20 ENCOUNTER — Ambulatory Visit: Payer: MEDICAID | Admitting: Family

## 2022-09-20 ENCOUNTER — Ambulatory Visit: Payer: Medicaid Other | Admitting: Family

## 2022-09-20 ENCOUNTER — Other Ambulatory Visit: Payer: Self-pay | Admitting: Family

## 2022-09-20 DIAGNOSIS — B20 Human immunodeficiency virus [HIV] disease: Secondary | ICD-10-CM

## 2022-10-16 ENCOUNTER — Ambulatory Visit: Payer: MEDICAID | Admitting: Family

## 2022-10-16 ENCOUNTER — Ambulatory Visit: Payer: Medicaid Other | Admitting: Family

## 2022-10-16 ENCOUNTER — Telehealth: Payer: Self-pay

## 2022-10-16 ENCOUNTER — Other Ambulatory Visit (HOSPITAL_COMMUNITY): Payer: Self-pay

## 2022-10-16 NOTE — Telephone Encounter (Signed)
Spoke to patient who takes Train from [place] to [place]. He can not get to appt today due to issue with train schedule. Will reschedule for 11/14/22 2:45 PM. Appt note mentioned need to schedule Sunlenca Inj. Patient states he will get this done in Sept/Oct. I sent note to [facility] to alert this as well

## 2022-11-07 ENCOUNTER — Other Ambulatory Visit (HOSPITAL_COMMUNITY): Payer: Self-pay

## 2022-11-08 ENCOUNTER — Other Ambulatory Visit: Payer: Self-pay

## 2022-11-09 ENCOUNTER — Other Ambulatory Visit: Payer: Self-pay | Admitting: Family

## 2022-11-09 DIAGNOSIS — B20 Human immunodeficiency virus [HIV] disease: Secondary | ICD-10-CM

## 2022-11-14 ENCOUNTER — Ambulatory Visit: Payer: MEDICAID | Admitting: Family

## 2022-11-15 ENCOUNTER — Other Ambulatory Visit (HOSPITAL_COMMUNITY): Payer: Self-pay

## 2022-11-16 ENCOUNTER — Telehealth: Payer: Self-pay

## 2022-11-16 ENCOUNTER — Ambulatory Visit: Payer: MEDICAID | Admitting: Pharmacist

## 2022-11-16 ENCOUNTER — Ambulatory Visit: Payer: MEDICAID | Admitting: Family

## 2022-11-16 NOTE — Telephone Encounter (Signed)
[facility] Patient Advocate Encounter  Patient's medication Timothy Wall) have been couriered to [facility] from [employer] and will be administered on the patient next office visit on 11/23/22.  [name] , CPhT Specialty Pharmacy Patient [facility] for Infectious Disease Phone: [phone] Fax:  [phone]

## 2022-11-21 ENCOUNTER — Other Ambulatory Visit: Payer: Self-pay

## 2022-11-23 ENCOUNTER — Other Ambulatory Visit: Payer: Self-pay

## 2022-11-23 ENCOUNTER — Ambulatory Visit (INDEPENDENT_AMBULATORY_CARE_PROVIDER_SITE_OTHER): Payer: No Typology Code available for payment source | Admitting: Family

## 2022-11-23 ENCOUNTER — Encounter: Payer: Self-pay | Admitting: Family

## 2022-11-23 ENCOUNTER — Ambulatory Visit (INDEPENDENT_AMBULATORY_CARE_PROVIDER_SITE_OTHER): Payer: MEDICAID | Admitting: Pharmacist

## 2022-11-23 VITALS — BP 118/79 | HR 86 | Temp 97.9°F | Ht 69.0 in | Wt 152.0 lb

## 2022-11-23 DIAGNOSIS — B181 Chronic viral hepatitis B without delta-agent: Secondary | ICD-10-CM

## 2022-11-23 DIAGNOSIS — Z23 Encounter for immunization: Secondary | ICD-10-CM | POA: Diagnosis not present

## 2022-11-23 DIAGNOSIS — B20 Human immunodeficiency virus [HIV] disease: Secondary | ICD-10-CM | POA: Diagnosis not present

## 2022-11-23 DIAGNOSIS — Z0000 Encounter for general adult medical examination without abnormal findings: Secondary | ICD-10-CM | POA: Diagnosis not present

## 2022-11-23 MED ORDER — LENACAPAVIR SODIUM 463.5 MG/1.5ML ~~LOC~~ SOLN
927.0000 mg | Freq: Once | SUBCUTANEOUS | Status: AC
  Administered 2022-11-23: 927 mg via SUBCUTANEOUS

## 2022-11-23 MED ORDER — BIKTARVY 50-200-25 MG PO TABS: 1.0000 | ORAL_TABLET | Freq: Every day | ORAL | 6 refills | Status: DC

## 2022-11-23 NOTE — Assessment & Plan Note (Signed)
Timothy Wall continues to have well-controlled virus with good adherence and tolerance to [employer] and lenacapavir.  Congratulated on his continued effort to remain undetectable and well-controlled.  Reviewed previous lab work and discussed plan of care.  Continue current dose of Biktarvy and lenacapavir.  Will arrange office visits to fit his work schedule so that he can continue to have minimal impacts from office visits.  Lenacapavir administered without complication.  Plan for follow-up in 6 months or sooner if needed with lab work on the same day.

## 2022-11-23 NOTE — Patient Instructions (Signed)
Nice to see you.  We will check your lab work today.  Continue to take your medication daily as prescribed.  Refills have been sent to the pharmacy.  Please call [facility] ([facility]) to schedule/follow up on your dental care at [phone]  Plan for follow up in 6 months or sooner if needed with lab work on the same day.   Have a great day and stay safe!  

## 2022-11-23 NOTE — Assessment & Plan Note (Signed)
Mr. Timothy Wall continues to have well-controlled chronic hepatitis B with current dose of Biktarvy taken for HIV.  Check lab work.  Check ultrasound for hepatocellular carcinoma screening.  Continue current dose of Biktarvy containing tenofovir.

## 2022-11-23 NOTE — Progress Notes (Signed)
HPI: Timothy Wall is a 27 y.o. male who presents to the [facility] pharmacy clinic for [facility] administration.  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 Administer each injection subcutaneously at separate sites in the abdomen (more or equal to 2 inches from the navel).  Modified Medications   Modified Medication Previous Medication   BIKTARVY 50-200-25 MG TABS TABLET BIKTARVY 50-200-25 MG TABS tablet      Take 1 tablet by mouth daily.    TAKE ONE TABLET BY MOUTH DAILY  Discontinued Medications   No medications on file    Allergies: No Known Allergies  Past Medical History: Past Medical History:  Diagnosis Date   Hepatitis B    HIV (human immunodeficiency virus infection) (HCC)     Social History: Social History   Socioeconomic History   Marital status: Single    Spouse name: Not on file   Number of children: Not on file   Years of education: Not on file   Highest education level: Not on file  Occupational History   Not on file  Tobacco Use   Smoking status: Former    Current packs/day: 0.25     Types: Cigarettes   Smokeless tobacco: Never   Tobacco comments:    Former cigarette smoker    vape  Advertising account planner   Vaping status: Never Used  Substance and Sexual Activity   Alcohol use: Yes   Drug use: No   Sexual activity: Not Currently    Partners: Female    Comment: declined condoms  Other Topics Concern   Not on file  Social History Narrative   Not on file   Social Determinants of Health   Financial Resource Strain: Not on file  Food Insecurity: Not on file  Transportation Needs: Not on file  Physical Activity: Not on file  Stress: Not on file  Social Connections: Not on file    Labs: Lab Results  Component Value Date   HIV1RNAQUANT Not Detected 03/14/2022   HIV1RNAQUANT 29 (H) 11/22/2021   HIV1RNAQUANT 3,910 (H) 08/25/2021   CD4TABS 303 (L) 03/14/2022   CD4TABS 401 11/22/2021   CD4TABS 268 (L) 08/25/2021    RPR and STI Lab Results  Component Value Date   LABRPR NON-REACTIVE 08/25/2021   LABRPR NON REAC 10/22/2012        No data to display          Hepatitis B Lab Results  Component Value Date   HEPBSAB NEG 10/22/2012   HEPBSAG REACTIVE (A) 03/14/2022   HEPBCAB POS (A) 10/22/2012   Hepatitis C No results found for: "HEPCAB", "HCVRNAPCRQN" Hepatitis A Lab Results  Component Value Date   HAV  NEG 10/22/2012   Lipids: Lab Results  Component Value Date   CHOL 134 08/25/2021   TRIG 70 08/25/2021   HDL 36 (L) 08/25/2021   CHOLHDL 3.7 08/25/2021   VLDL 18 10/22/2012   LDLCALC 83 08/25/2021    Current HIV Regimen: Biktarvy + Sunlenca  TARGET DATE: The 19th of the month  Assessment: Timothy Wall presents today for maintenance Sunlenca injections. Administered lenacapavir 463.5/1.2mL subcutaneously in upper left abdomen. Administered lenacapavir 463.5/1.38mL subcutaneously in lower left abdomen more than two inches away from first injection site. Monitored patient for 10 minutes after injection. Injections were tolerated well without issue.  Will follow up with me in 6 months for next injection. Taking Biktarvy daily without missed doses or adverse effects.    Plan: - Continue Biktarvy once daily - Administer Sunlenca 972 mg  - Follow up with me on 3/25 - Contact for any issues or questions  Margarite Gouge, PharmD, CPP, BCIDP, AAHIVP Clinical Pharmacist Practitioner Infectious Diseases Clinical Pharmacist [facility]

## 2022-11-23 NOTE — Assessment & Plan Note (Signed)
Discussed importance of safe sexual practice and condom use. Condoms and STD testing offered.  Influenza vaccination up to date. Will schedule routine dental care with [facility] now that he is undetectable.

## 2022-11-23 NOTE — Progress Notes (Signed)
Brief Narrative   Patient ID: Timothy [place], male    DOB: Apr 01, 1995, 27 y.o.   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 Initial CD4 count, viral load, and genotype are unavailable. 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    Subjective:    Chief Complaint  Patient presents with   Follow-up   HIV Positive/AIDS   Hepatitis B    HPI:  Timothy Wall is a 27 y.o. male with HIV disease and hepatitis B last seen on 03/14/2022 with well-controlled virus and good adherence and tolerance to Biktarvy and lenacapravir.  Viral load was undetectable with CD4 count of 303.  Kidney function electrolytes within normal ranges.  Hepatitis B DNA level of 545 with positive surface antigen and no evidence of flare.  Here today for follow-up.  Tim[place] has been doing well since his last office visit and is [place] in [place] and working full-time.  He travels to the clinic via [employer] which costs approximately $14.  He is considering transferring care to [facility] although feels comfortable with current situation and is willing to travel radically for his injections.  Continues to take Biktarvy as prescribed with no adverse side effects.  Is comfortable with current regimen.  Has goals to see his son graduate from high school in the future.  Condoms and STD testing offered.  Healthcare maintenance due includes influenza vaccination.  Denies fevers,  chills, night sweats, headaches, changes in vision, neck pain/stiffness, nausea, diarrhea, vomiting, lesions or rashes.  Lab Results  Component Value Date   CD4TCELL 30 (L) 03/14/2022   CD4TABS 303 (L) 03/14/2022   Lab Results  Component Value Date   HIV1RNAQUANT Not Detected 03/14/2022     No Known Allergies    Outpatient Medications Prior to Visit  Medication Sig Dispense Refill   SQ injection lenacapavir (SUNLENCA) 463.5 MG/1.5ML SQ injection Inject 3 mLs (927 mg total) into the skin every 6 (six) months. Administer each injection subcutaneously at separate sites in the abdomen (more or equal to 2 inches from the navel). 3 mL 2   BIKTARVY 50-200-25 MG TABS tablet TAKE ONE TABLET BY MOUTH DAILY 30 tablet 1   clotrimazole (CLOTRIMAZOLE AF) 1 % cream Apply 1 Application topically 2 (two) times daily. (Patient not taking: Reported on 11/23/2022) 30 g 0   No facility-administered medications prior to visit.     Past Medical History:  Diagnosis Date   Hepatitis B    HIV (human immunodeficiency virus infection) (HCC)      Past Surgical History:  Procedure Laterality Date   EYE SURGERY        Review of Systems  Constitutional:  Negative for appetite change, chills, fatigue, fever and unexpected weight change.  Eyes:  Negative for visual disturbance.  Respiratory:  Negative for cough, chest tightness, shortness of breath and wheezing.   Cardiovascular:  Negative for chest pain and leg swelling.  Gastrointestinal:  Negative for abdominal pain, constipation, diarrhea, nausea and vomiting.  Genitourinary:  Negative for dysuria, flank pain, frequency, genital sores,  hematuria and urgency.  Skin:  Negative for rash.  Allergic/Immunologic: Negative for immunocompromised state.  Neurological:  Negative for dizziness and headaches.      Objective:    BP 118/79   Pulse 86   Temp 97.9 F (36.6 C) (Temporal)   Ht 5\' 9"  (1.753 m)   Wt 152 lb (68.9 kg)   SpO2 97%   BMI 22.45  kg/m  Nursing note and vital signs reviewed.  Physical Exam Constitutional:      General: He is not in acute distress.    Appearance: He is well-developed.  Eyes:     Conjunctiva/sclera: Conjunctivae normal.  Cardiovascular:     Rate and Rhythm: Normal rate and regular rhythm.     Heart sounds: Normal heart sounds. No murmur heard.    No friction rub. No gallop.  Pulmonary:     Effort: Pulmonary effort is normal. No respiratory distress.     Breath sounds: Normal breath sounds. No wheezing or rales.  Chest:     Chest wall: No tenderness.  Abdominal:     General: Bowel sounds are normal.     Palpations: Abdomen is soft.     Tenderness: There is no abdominal tenderness.  Musculoskeletal:     Cervical back: Neck supple.  Lymphadenopathy:     Cervical: No cervical adenopathy.  Skin:    General: Skin is warm and dry.     Findings: No rash.  Neurological:     Mental Status: He is alert and oriented to person, place, and time.  Psychiatric:        Behavior: Behavior normal.        Thought Content: Thought content normal.        Judgment: Judgment normal.         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) 10/23/2012   Corneal scarring 10/23/2012   Hepatitis B infection associated with human immunodeficiency virus infection (HCC) 10/23/2012   Unspecified essential hypertension 10/23/2012   Human immunodeficiency  virus (HIV) disease (HCC) 10/02/2012   Chronic hepatitis B (HCC) 10/02/2012   Systolic hypertension 10/02/2012   Blindness of left eye 10/02/2012     Problem List Items Addressed This Visit       Digestive   Chronic hepatitis B [facility])    Timothy [place] continues to have well-controlled chronic hepatitis B with current dose of Biktarvy taken for HIV.  Check lab work.  Check ultrasound for hepatocellular carcinoma screening.  Continue current dose of Biktarvy containing tenofovir.      Relevant Medications   BIKTARVY 50-200-25 MG TABS tablet   Other Relevant Orders   Hepatitis B surface antigen   Hepatitis B surface antibody,qualitative   Hepatitis B DNA, ultraquantitative, PCR   US ABDOMEN COMPLETE W/ELASTOGRAPHY   Hepatic function panel  Other   Human immunodeficiency virus (HIV) disease (HCC) - Primary (Chronic)    Wall continues to have well-controlled virus with good adherence and tolerance to [employer] and lenacapavir.  Congratulated on his continued effort to remain undetectable and well-controlled.  Reviewed previous lab work and discussed plan of care.  Continue current dose of Biktarvy and lenacapavir.  Will arrange office visits to fit his work schedule so that he can continue to have minimal impacts from office visits.  Lenacapavir administered without complication.  Plan for follow-up in 6 months or sooner if needed with lab work on the same day.      Relevant Medications   BIKTARVY 50-200-25 MG TABS tablet   Other Relevant Orders   HIV-1 RNA quant-no reflex-bld   T-helper cell (CD4)- ([facility] clinic only)   BASIC METABOLIC PANEL WITH GFR   Healthcare maintenance    Discussed importance of safe sexual practice and condom use. Condoms and STD testing offered.  Influenza vaccination up to date. Will schedule routine dental care with [facility] now that he is undetectable.       Other Visit Diagnoses     Encounter for immunization       Relevant Orders   Flu vaccine  trivalent PF, 6mos and older(Flulaval,Afluria,Fluarix,Fluzone) (Completed)        I have discontinued [place]'s clotrimazole. I have also changed his Biktarvy. Additionally, I am having him maintain his Sunlenca.   Meds ordered this encounter  Medications   BIKTARVY 50-200-25 MG TABS tablet    Sig: Take 1 tablet by mouth daily.    Dispense:  30 tablet    Refill:  6    Order Specific Question:   Supervising Provider    Answer:   Judyann Munson [[id number]]     Follow-up: Return in about 6 months (around 05/23/2023). or sooner if needed.    Marcos Eke, MSN, FNP-C Nurse Practitioner [facility] for Infectious Disease [facility] Main number: [phone]

## 2022-11-24 LAB — T-HELPER CELL (CD4) - (RCID CLINIC ONLY)
CD4 % Helper T Cell: 34 % (ref 33–65)
CD4 T Cell Abs: 395 /uL — ABNORMAL LOW (ref 400–1790)

## 2022-11-25 LAB — BASIC METABOLIC PANEL WITH GFR
BUN: 23 mg/dL (ref 7–25)
CO2: 28 mmol/L (ref 20–32)
Calcium: 9.8 mg/dL (ref 8.6–10.3)
Chloride: 104 mmol/L (ref 98–110)
Creat: 0.97 mg/dL (ref 0.60–1.24)
Glucose, Bld: 76 mg/dL (ref 65–99)
Potassium: 4.3 mmol/L (ref 3.5–5.3)
Sodium: 140 mmol/L (ref 135–146)
eGFR: 108 mL/min/{1.73_m2} (ref >=60)

## 2022-11-25 LAB — HEPATITIS B SURFACE ANTIGEN: Hepatitis B Surface Ag: REACTIVE — AB

## 2022-11-25 LAB — HIV-1 RNA QUANT-NO REFLEX-BLD
HIV 1 RNA Quant: <20 {copies}/mL — ABNORMAL HIGH
HIV-1 RNA Quant, Log: <1.3 {Log_copies}/mL — ABNORMAL HIGH

## 2022-11-25 LAB — HEPATIC FUNCTION PANEL
AG Ratio: 1.7 (calc) (ref 1.0–2.5)
ALT: 30 U/L (ref 9–46)
AST: 25 U/L (ref 10–40)
Albumin: 4.7 g/dL (ref 3.6–5.1)
Alkaline phosphatase (APISO): 75 U/L (ref 36–130)
Bilirubin, Direct: 0.2 mg/dL (ref 0.0–0.2)
Globulin: 2.7 g/dL (ref 1.9–3.7)
Indirect Bilirubin: 0.7 mg/dL (ref 0.2–1.2)
Total Bilirubin: 0.9 mg/dL (ref 0.2–1.2)
Total Protein: 7.4 g/dL (ref 6.1–8.1)

## 2022-11-25 LAB — HEPATITIS B DNA, ULTRAQUANTITATIVE, PCR
Hepatitis B DNA: 299 [IU]/mL — ABNORMAL HIGH
Hepatitis B virus DNA: 2.48 {Log_IU}/mL — ABNORMAL HIGH

## 2022-11-25 LAB — HEPATITIS B SURFACE ANTIBODY,QUALITATIVE: Hep B S Ab: NONREACTIVE

## 2023-01-18 ENCOUNTER — Ambulatory Visit (HOSPITAL_COMMUNITY): Payer: MEDICAID

## 2023-01-24 ENCOUNTER — Other Ambulatory Visit: Payer: Self-pay

## 2023-04-16 ENCOUNTER — Ambulatory Visit (HOSPITAL_COMMUNITY): Payer: MEDICAID | Attending: Family

## 2023-04-16 ENCOUNTER — Other Ambulatory Visit (HOSPITAL_COMMUNITY): Payer: MEDICAID

## 2023-05-01 ENCOUNTER — Other Ambulatory Visit: Payer: Self-pay

## 2023-05-16 ENCOUNTER — Ambulatory Visit: Payer: MEDICAID | Admitting: Pharmacist

## 2023-05-28 NOTE — Progress Notes (Unsigned)
 HPI: [place] is a 28 y.o. male who presents to the [facility] pharmacy clinic for HIV follow-up.  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   SQ INJECTION LENACAPAVIR (SUNLENCA) 463.5 MG/1.5ML SQ INJECTION    Inject 3 mLs (927 mg total) into the skin every 6 (six) months. Administer each injection subcutaneously at separate sites in the abdomen (more or equal to 2 inches from the navel).  Modified Medications   No medications on file  Discontinued Medications   No medications on file    Labs: Lab Results  Component Value Date   HIV1RNAQUANT <20 (H) 11/23/2022   HIV1RNAQUANT Not Detected 03/14/2022   HIV1RNAQUANT 29 (H) 11/22/2021   CD4TABS 395 (L) 11/23/2022   CD4TABS 303 (L) 03/14/2022   CD4TABS 401 11/22/2021    RPR and STI Lab Results  Component Value Date   LABRPR NON-REACTIVE 08/25/2021   LABRPR NON REAC 10/22/2012        No data to display          Hepatitis B Lab Results  Component Value Date   HEPBSAB NON-REACTIVE 11/23/2022   HEPBSAG REACTIVE (A) 11/23/2022   HEPBCAB POS (A) 10/22/2012   Hepatitis C No results found for: "HEPCAB",  "HCVRNAPCRQN" Hepatitis A Lab Results  Component Value Date   HAV NEG 10/22/2012   Lipids: Lab Results  Component Value Date   CHOL 134 08/25/2021   TRIG 70 08/25/2021   HDL 36 (L) 08/25/2021   CHOLHDL 3.7 08/25/2021   VLDL 18 10/22/2012   LDLCALC 83 08/25/2021   Current HIV Regimen: Biktarvy + Sunlenca   TARGET DATE: The 19th of the month   Assessment: [place] presents today for maintenance Sunlenca injections. He tolerated his last injections very well with no side effects. He does state that he is having trouble filling his Biktarvy due to insurance problems. He has insurance through his employer and Medicaid. 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 I had him meet with Lupita Leash, and she got to the bottom of the issue. He is good to go and can fill his Biktarvy. He states that he has been without it for a month. I asked him how since it looks like he last filled on 05/04/23 and then he said "well maybe I have a few left then". Unsure how much he truly missed. Educated him on the utmost importance of taking his Biktarvy every day because he cannot be on [place] alone. All questions and concerns addressed.  Administered lenacapavir 463.5/1.74mL subcutaneously in upper right abdomen. Administered lenacapavir 463.5/1.87mL subcutaneously in lower right abdomen more than two inches away from first injection site. Monitored patient for 10 minutes after  injection. Injections were tolerated well without issue. Will follow up with me in 6 months for next injection. He will see Tammy Sours in 3 months.    Plan: - Continue Biktarvy once daily - insurance issues resolved - Administer Sunlenca 972 mg  - Follow up with Tammy Sours on 08/27/23 - Follow up with Marchelle Folks for next Sunlenca injections on 11/27/23 - Contact for any issues or questions  Ronav Furney L. Carmella Kees, PharmD, BCIDP, AAHIVP, CPP Clinical Pharmacist  Practitioner Infectious Diseases Clinical Pharmacist [facility] 05/28/2023, 3:24 PM

## 2023-05-29 ENCOUNTER — Other Ambulatory Visit (HOSPITAL_COMMUNITY): Payer: Self-pay

## 2023-05-29 ENCOUNTER — Other Ambulatory Visit: Payer: Self-pay

## 2023-05-29 ENCOUNTER — Ambulatory Visit (INDEPENDENT_AMBULATORY_CARE_PROVIDER_SITE_OTHER): Payer: MEDICAID | Admitting: Pharmacist

## 2023-05-29 DIAGNOSIS — B20 Human immunodeficiency virus [HIV] disease: Secondary | ICD-10-CM | POA: Diagnosis not present

## 2023-05-29 MED ORDER — LENACAPAVIR SODIUM 463.5 MG/1.5ML ~~LOC~~ SOLN
927.0000 mg | Freq: Once | SUBCUTANEOUS | Status: AC
Start: 2023-05-29 — End: 2023-05-29
  Administered 2023-05-29: 927 mg via SUBCUTANEOUS

## 2023-05-29 MED ORDER — SUNLENCA 463.5 MG/1.5ML ~~LOC~~ SOLN: 927.0000 mg | SUBCUTANEOUS | 2 refills | Status: DC

## 2023-05-30 ENCOUNTER — Other Ambulatory Visit: Payer: Self-pay

## 2023-05-30 ENCOUNTER — Other Ambulatory Visit (HOSPITAL_COMMUNITY): Payer: Self-pay

## 2023-05-30 ENCOUNTER — Other Ambulatory Visit: Payer: Self-pay | Admitting: Pharmacist

## 2023-05-30 ENCOUNTER — Telehealth: Payer: Self-pay

## 2023-05-30 DIAGNOSIS — B20 Human immunodeficiency virus [HIV] disease: Secondary | ICD-10-CM

## 2023-05-30 MED ORDER — SUNLENCA 463.5 MG/1.5ML ~~LOC~~ SOLN
927.0000 mg | SUBCUTANEOUS | 2 refills | Status: AC
  Filled 2023-05-30: qty 3, 180d supply, fill #0
  Filled 2023-11-13: qty 3, 180d supply, fill #1

## 2023-05-30 NOTE — Telephone Encounter (Signed)
 Received notification from [facility] regarding a prior authorization for [facility]. Authorization has been APPROVED from 05/28/2024 to  05/28/2024.   Per test claim, copay for 180 days supply is $0  Patient can fill through [facility]: [phone]   Authorization # [id number]

## 2023-05-30 NOTE — Progress Notes (Signed)
 Specialty Pharmacy Refill Coordination Note  Catalino Plascencia is a 28 y.o. male assessed today regarding refills of clinic administered specialty medication(s) Lenacapavir Sodium Ginette Pitman)   Clinic requested Courier to Provider Office   Delivery date: 06/01/23   Verified address: [address] [place] [address]   Medication will be filled on 05/31/23.

## 2023-05-31 ENCOUNTER — Other Ambulatory Visit: Payer: Self-pay

## 2023-05-31 ENCOUNTER — Other Ambulatory Visit (HOSPITAL_COMMUNITY): Payer: Self-pay

## 2023-05-31 ENCOUNTER — Other Ambulatory Visit: Payer: Self-pay | Admitting: Pharmacist

## 2023-05-31 DIAGNOSIS — B20 Human immunodeficiency virus [HIV] disease: Secondary | ICD-10-CM

## 2023-05-31 LAB — HIV-1 RNA QUANT-NO REFLEX-BLD
HIV 1 RNA Quant: NOT DETECTED {copies}/mL
HIV-1 RNA Quant, Log: NOT DETECTED {Log_copies}/mL

## 2023-05-31 MED ORDER — BICTEGRAVIR-EMTRICITAB-TENOFOV 50-200-25 MG PO TABS
1.0000 | ORAL_TABLET | Freq: Every day | ORAL | Status: AC
Start: 2023-05-29 — End: 2023-06-26

## 2023-05-31 NOTE — Progress Notes (Signed)
 Medication Samples have been provided to the patient.  Drug name: Biktarvy        Strength: 50/200/25 mg       Qty: 28 tablets (4 bottles) LOT: [id number]   Exp.Date: 6/27  Samples requested by [name] (insurance issues).  Dosing instructions: Take one tablet by mouth once daily  The patient has been instructed regarding the correct time, dose, and frequency of taking this medication, including desired effects and most common side effects.   Margarite Gouge, PharmD, CPP, BCIDP, AAHIVP Clinical Pharmacist Practitioner Infectious Diseases Clinical Pharmacist [facility] for Infectious Disease

## 2023-06-01 ENCOUNTER — Telehealth: Payer: Self-pay

## 2023-06-01 NOTE — Telephone Encounter (Signed)
 It will be administered in September since he recently received a dose this month - thanks Lupita Leash!

## 2023-06-01 NOTE — Telephone Encounter (Signed)
 [facility] Patient Advocate Encounter  Patient's medications Sunlenca have been couriered to [facility] from [facility] and will be administered at the patients appointment on 05/29/23.  [name], CPhT Specialty Pharmacy Patient [facility] for Infectious Disease Phone: [phone] Fax:  [phone]

## 2023-08-27 ENCOUNTER — Other Ambulatory Visit: Payer: Self-pay

## 2023-08-27 ENCOUNTER — Encounter: Payer: Self-pay | Admitting: Family

## 2023-08-27 ENCOUNTER — Ambulatory Visit (INDEPENDENT_AMBULATORY_CARE_PROVIDER_SITE_OTHER): Admitting: Family

## 2023-08-27 VITALS — BP 136/79 | HR 82 | Temp 98.1°F | Wt 149.0 lb

## 2023-08-27 DIAGNOSIS — B181 Chronic viral hepatitis B without delta-agent: Secondary | ICD-10-CM

## 2023-08-27 DIAGNOSIS — Z113 Encounter for screening for infections with a predominantly sexual mode of transmission: Secondary | ICD-10-CM

## 2023-08-27 DIAGNOSIS — Z0000 Encounter for general adult medical examination without abnormal findings: Secondary | ICD-10-CM

## 2023-08-27 DIAGNOSIS — L853 Xerosis cutis: Secondary | ICD-10-CM | POA: Diagnosis not present

## 2023-08-27 DIAGNOSIS — B20 Human immunodeficiency virus [HIV] disease: Secondary | ICD-10-CM | POA: Diagnosis not present

## 2023-08-27 MED ORDER — BIKTARVY 50-200-25 MG PO TABS: 1.0000 | ORAL_TABLET | Freq: Every day | ORAL | 6 refills | Status: AC

## 2023-08-27 NOTE — Assessment & Plan Note (Signed)
 [place] has dry skin and borderline dermatitis. Treat conservative with moisturizing soap and water for the face and Selsun blue or Head/Shoulders for the scalp. If symptoms worsen or do not improve will refer to dermatology for further evaluation.

## 2023-08-27 NOTE — Progress Notes (Signed)
 Brief Narrative   Patient ID: Timothy Wall, male    DOB: 06/11/1995, 28 y.o.   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 Initial CD4 count, viral load, and genotype are unavailable. Genotype from 02/20/13  with NRTI mutations of M41L, M184V, T215Y conferring resistance to zidovudine, emtriciabine, lamivudine, and intermediate resistance to abacavir and low level resistance to tenofovir ; NNRTI mutations of L100I, V179D conferring resistance to rilpivirine, nevirapine, and efavrienz with intermediate resistance to etravirine, and low level resistance to doravirine ; and PI mutations D30N and N88D conferring low level resistance to atazanivir/r with susceptibility to darunavir /r, and lopinavir /r.    Subjective:   Chief Complaint  Patient presents with   Follow-up    B20, Hepatitis B    HPI:  Timothy Wall is a 28 y.o. male with HIV disease and chronic hepatitis B last seen on 05/29/2023 by Sandford Croon, RPH-CPP with good adherence and tolerance to Sunlenca  and Biktarvy  and well-controlled virus.  Viral load was undetectable.  Previous CD4 count 395.  Kidney function, liver function, electrolytes within normal ranges.  Here today for routine follow-up.  Timothy Wall has been doing well since his last office visit and continues to take Biktarvy  and tolerate Sunlenca  as prescribed with no adverse side effects.  Continues to live in [place] with stable housing, access to food, and transportation.  Expecting a baby girl in December of this year.  Working full-time.  Has concern about dry skin and dry scalp primarily on his face.  Does not currently wash his face on a regular basis and questions if he needs to be seen by dermatology.  Healthcare maintenance reviewed.  Sexually active with site-specific STD testing and condoms offered.  Due for routine dental care requesting referral to dental services.  Denies fevers,  chills, night sweats, headaches, changes in vision, neck pain/stiffness, nausea, diarrhea, vomiting, lesions or rashes.  Lab Results  Component Value Date   CD4TCELL 34 11/23/2022   CD4TABS 395 (L) 11/23/2022   Lab Results  Component Value Date   HIV1RNAQUANT NOT DETECTED 05/29/2023     No Known Allergies    Outpatient Medications Prior to Visit  Medication Sig Dispense Refill   SQ injection lenacapavir (SUNLENCA ) 463.5 MG/1.5ML SQ injection Inject 3 mLs (927 mg total) into the skin every 6 (six) months. Administer each injection subcutaneously at separate sites in the abdomen (more or equal to 2 inches from the navel). 3 mL 2   BIKTARVY  50-200-25 MG TABS tablet Take 1 tablet by mouth daily. 30 tablet 6   No facility-administered medications prior to visit.     Past Medical History:  Diagnosis Date   Hepatitis B    HIV (human immunodeficiency virus infection) (HCC)      Past Surgical History:  Procedure Laterality Date   EYE SURGERY          Review of Systems  Constitutional:  Negative for appetite change, chills, fatigue, fever and unexpected weight change.  Eyes:  Negative for visual disturbance.  Respiratory:  Negative for cough, chest tightness, shortness of breath and wheezing.   Cardiovascular:  Negative for chest pain and leg swelling.  Gastrointestinal:  Negative for abdominal pain, constipation, diarrhea, nausea and vomiting.  Genitourinary:  Negative for dysuria, flank pain, frequency, genital sores, hematuria and urgency.  Skin:  Negative for rash.  Allergic/Immunologic: Negative for immunocompromised state.  Neurological:  Negative for dizziness and headaches.  Objective:   BP 136/79   Pulse 82   Temp 98.1 F (36.7 C) (Oral)   Wt 149 lb (67.6 kg)   SpO2 96%   BMI 22.00 kg/m  Nursing note and vital signs reviewed.  Physical Exam Constitutional:      General: He is not in acute distress.    Appearance: He is well-developed.   Eyes:      Conjunctiva/sclera: Conjunctivae normal.    Cardiovascular:     Rate and Rhythm: Normal rate and regular rhythm.     Heart sounds: Normal heart sounds. No murmur heard.    No friction rub. No gallop.  Pulmonary:     Effort: Pulmonary effort is normal. No respiratory distress.     Breath sounds: Normal breath sounds. No wheezing or rales.  Chest:     Chest wall: No tenderness.  Abdominal:     General: Bowel sounds are normal.     Palpations: Abdomen is soft.     Tenderness: There is no abdominal tenderness.   Musculoskeletal:     Cervical back: Neck supple.  Lymphadenopathy:     Cervical: No cervical adenopathy.   Skin:    General: Skin is warm and dry.     Findings: No rash.   Neurological:     Mental Status: He is alert and oriented to person, place, and time.   Psychiatric:        Mood and Affect: Mood normal.          08/27/2023    1:18 PM 11/23/2022    2:06 PM 03/14/2022    1:51 PM 11/22/2021    2:27 PM 07/15/2013   11:31 AM  Depression screen PHQ 2/9  Decreased Interest 0 0 0 0 0  Down, Depressed, Hopeless 0 0 0 0 0  PHQ - 2 Score 0 0 0 0 0  Altered sleeping 0      Tired, decreased energy 0      Change in appetite 0      Feeling bad or failure about yourself  0      Trouble concentrating 0      Moving slowly or fidgety/restless 0      PHQ-9 Score 0      Difficult doing work/chores Not difficult at all            08/27/2023    1:18 PM  GAD 7 : Generalized Anxiety Score  Nervous, Anxious, on Edge 0  Control/stop worrying 0  Worry too much - different things 0  Trouble relaxing 0  Restless 0  Easily annoyed or irritable 0  Afraid - awful might happen 0  Total GAD 7 Score 0  Anxiety Difficulty Not difficult at all     The ASCVD Risk score (Arnett DK, et al., 2019) failed to calculate for the following reasons:   The 2019 ASCVD risk score is only valid for ages 17 to 42      Assessment & Plan:    Patient Active Problem List   Diagnosis Date  Noted   Dry skin 08/27/2023   Great toe pain, right 03/14/2022   Healthcare maintenance 11/22/2021   Recurrent major depressive disorder, in partial remission (HCC) 08/26/2021   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   ADHD (attention deficit hyperactivity disorder) 10/23/2012   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  No current symptoms or flares.  Reminded to not stop medication abruptly as this may be related to a flare.  Check lab work.  Continue current dose of Biktarvy .      Relevant Medications   BIKTARVY  50-200-25 MG TABS tablet   Other Relevant Orders   Basic metabolic panel with GFR   Hepatic function panel   CBC   Hepatitis B surface antigen   Hepatitis B surface antibody,qualitative   Hepatitis B DNA, ultraquantitative, PCR   Hepatitis delta antibody     Musculoskeletal and Integument   Dry skin   Timothy Wall has dry skin and borderline dermatitis. Treat conservative with moisturizing soap and water for the face and Selsun blue or Head/Shoulders for the scalp. If symptoms worsen or do not improve will refer to dermatology for further evaluation.         Other   Human immunodeficiency virus (HIV) disease (HCC) - Primary (Chronic)   Timothy Wall continues to have well-controlled virus with good adherence and tolerance to Biktarvy  and Sunlenca .  Reviewed previous lab work and discussed plan of care, U equals U, and family-planning.  Covered by  Medicaid and partners tailored plan with no problems obtaining medication.  Social determinants of health reviewed with no interventions indicated.  Check blood work.  Continue current dose of Biktarvy  and Sunlenca .  Plan for follow-up in 9 months or sooner if needed with lab work on the same day and with pharmacy provider in between for some likely injection.      Relevant Medications   BIKTARVY  50-200-25 MG TABS tablet   Other Relevant Orders   Basic metabolic panel with GFR   Hepatic function panel   CBC   HIV-1 RNA quant-no reflex-bld   T-helper cells (CD4) count (not at [facility])   AMB REFERRAL TO COMMUNITY SERVICE AGENCY   Healthcare maintenance   Discussed importance of safe sexual practice and condom use. Condoms and site specific STD testing offered.  Vaccinations reviewed and up to date.  Due for routine dental care with referral placed to [facility].       Other Visit Diagnoses       Screening for STDs (sexually transmitted diseases)       Relevant Orders   RPR        I am having Timothy Wall maintain his Sunlenca  and Biktarvy .   Meds ordered this encounter  Medications   BIKTARVY  50-200-25 MG TABS tablet    Sig: Take 1 tablet by mouth daily.    Dispense:  30 tablet    Refill:  6    Supervising Provider:   Liane Redman [phone]    Prescription Type::   Renewal     Follow-up: Return in about 9 months (around 05/26/2024). or sooner if needed.    Marlan Silva, MSN, FNP-C Nurse Practitioner [facility] for Infectious Disease [facility] Main number: [phone]

## 2023-08-27 NOTE — Assessment & Plan Note (Signed)
 Timothy Wall continues to have well-controlled virus with good adherence and tolerance to Biktarvy  and Sunlenca .  Reviewed previous lab work and discussed plan of care, U equals U, and family-planning.  Covered by Medicaid and partners tailored plan with no problems obtaining medication.  Social determinants of health reviewed with no interventions indicated.  Check blood work.  Continue current dose of Biktarvy  and Sunlenca .  Plan for follow-up in 9 months or sooner if needed with lab work on the same day and with pharmacy provider in between for some likely injection.

## 2023-08-27 NOTE — Assessment & Plan Note (Signed)
 Chronic hepatitis B appears adequately controlled with current dose of Biktarvy  containing tenofovir .  No current symptoms or flares.  Reminded to not stop medication abruptly as this may be related to a flare.  Check lab work.  Continue current dose of Biktarvy .

## 2023-08-27 NOTE — Assessment & Plan Note (Signed)
 Discussed importance of safe sexual practice and condom use. Condoms and site specific STD testing offered.  Vaccinations reviewed and up to date.  Due for routine dental care with referral placed to [facility] clinic.

## 2023-08-27 NOTE — Patient Instructions (Addendum)
 Nice to see you.  We will check your lab work today.  Continue to take your medication daily as prescribed.  Refills have been sent to the pharmacy.  Please call [facility]) to schedule/follow up on your dental care at [phone]  Recommend Dove soap and Selsun Blue for the hair. If that is not effective let us  know.   Plan for follow up in 9 months or sooner if needed with lab work on the same day.  Have a great day and stay safe!

## 2023-08-30 ENCOUNTER — Ambulatory Visit: Payer: Self-pay | Admitting: Family

## 2023-09-01 LAB — HEPATIC FUNCTION PANEL
AG Ratio: 1.5 (calc) (ref 1.0–2.5)
ALT: 37 U/L (ref 9–46)
AST: 27 U/L (ref 10–40)
Albumin: 4.3 g/dL (ref 3.6–5.1)
Alkaline phosphatase (APISO): 68 U/L (ref 36–130)
Bilirubin, Direct: 0.3 mg/dL — ABNORMAL HIGH (ref 0.0–0.2)
Globulin: 2.9 g/dL (ref 1.9–3.7)
Indirect Bilirubin: 1.1 mg/dL (ref 0.2–1.2)
Total Bilirubin: 1.4 mg/dL — ABNORMAL HIGH (ref 0.2–1.2)
Total Protein: 7.2 g/dL (ref 6.1–8.1)

## 2023-09-01 LAB — CBC
HCT: 41.5 % (ref 38.5–50.0)
Hemoglobin: 13.4 g/dL (ref 13.2–17.1)
MCH: 29.5 pg (ref 27.0–33.0)
MCHC: 32.3 g/dL (ref 32.0–36.0)
MCV: 91.2 fL (ref 80.0–100.0)
MPV: 11.3 fL (ref 7.5–12.5)
Platelets: 265 10*3/uL (ref 140–400)
RBC: 4.55 10*6/uL (ref 4.20–5.80)
RDW: 12.1 % (ref 11.0–15.0)
WBC: 3.3 10*3/uL — ABNORMAL LOW (ref 3.8–10.8)

## 2023-09-01 LAB — BASIC METABOLIC PANEL WITH GFR
BUN: 13 mg/dL (ref 7–25)
CO2: 25 mmol/L (ref 20–32)
Calcium: 9.5 mg/dL (ref 8.6–10.3)
Chloride: 107 mmol/L (ref 98–110)
Creat: 0.78 mg/dL (ref 0.60–1.26)
Glucose, Bld: 84 mg/dL (ref 65–99)
Potassium: 3.9 mmol/L (ref 3.5–5.3)
Sodium: 140 mmol/L (ref 135–146)
eGFR: 123 mL/min/{1.73_m2} (ref >=60)

## 2023-09-01 LAB — HEPATITIS DELTA ANTIBODY: Hepatitis D Ab, Total: NEGATIVE

## 2023-09-01 LAB — T-HELPER CELLS (CD4) COUNT (NOT AT ARMC)
Absolute CD4: 385 {cells}/uL — ABNORMAL LOW (ref 490–1740)
CD4 T Helper %: 41 % (ref 30–61)
Total lymphocyte count: 943 {cells}/uL (ref 850–3900)

## 2023-09-01 LAB — HEPATITIS B DNA, ULTRAQUANTITATIVE, PCR
Hepatitis B DNA: <10 [IU]/mL — ABNORMAL HIGH
Hepatitis B virus DNA: <1 {Log_IU}/mL — ABNORMAL HIGH

## 2023-09-01 LAB — HIV-1 RNA QUANT-NO REFLEX-BLD
HIV 1 RNA Quant: <20 {copies}/mL — AB
HIV-1 RNA Quant, Log: <1.3 {Log_copies}/mL — AB

## 2023-09-01 LAB — HEPATITIS B SURFACE ANTIGEN: Hepatitis B Surface Ag: REACTIVE — AB

## 2023-09-01 LAB — HEPATITIS B SURFACE ANTIBODY,QUALITATIVE: Hep B S Ab: NONREACTIVE

## 2023-09-01 LAB — RPR: RPR Ser Ql: NONREACTIVE

## 2023-11-13 ENCOUNTER — Other Ambulatory Visit: Payer: Self-pay

## 2023-11-13 ENCOUNTER — Other Ambulatory Visit (HOSPITAL_COMMUNITY): Payer: Self-pay

## 2023-11-13 NOTE — Progress Notes (Signed)
 Specialty Pharmacy Refill Coordination Note  Timothy Wall is a 28 y.o. male assessed today regarding refills of clinic administered specialty medication(s) Lenacapavir Sodium  (Sunlenca )   Clinic requested Courier to Provider Office   Delivery date: 11/22/23   Verified address: [address] [place] [address]   Medication will be filled on 11/21/23.

## 2023-11-19 ENCOUNTER — Telehealth: Payer: Self-pay

## 2023-11-19 NOTE — Telephone Encounter (Signed)
 Patient called stating that he went to ED at [facility] in [place] - was told test results came back positive for gonorrhea. Per ED note - Patient was given Ceftriaxone 500 mg while admitted.   Kalese Ensz SHAUNNA Letters, CMA

## 2023-11-21 ENCOUNTER — Other Ambulatory Visit: Payer: Self-pay

## 2023-11-23 ENCOUNTER — Telehealth: Payer: Self-pay

## 2023-11-23 NOTE — Telephone Encounter (Signed)
 [facility] Patient Advocate Encounter  Patient's medications SUNLENCA  have been couriered to [facility] from [facility] and will be administered at the patients appointment on 11/27/23.  Charmaine Sharps, CPhT Specialty Pharmacy Patient [facility] for Infectious Disease Phone: [phone] Fax:  [phone]

## 2023-11-26 NOTE — Progress Notes (Unsigned)
   HPI: Brison Fiumara is a 28 y.o. male who presents to the [facility] pharmacy clinic for HIV follow-up.  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   SQ INJECTION LENACAPAVIR (SUNLENCA ) 463.5 MG/1.5ML SQ INJECTION    Inject 3 mLs (927 mg total) into the skin every 6 (six) months. Administer each injection subcutaneously at separate sites in the abdomen (more or equal to 2 inches from the navel).  Modified Medications   No medications on file  Discontinued Medications   No medications on file    Labs: Lab Results  Component Value Date   HIV1RNAQUANT <20 DETECTED (A) 08/27/2023   HIV1RNAQUANT NOT DETECTED 05/29/2023   HIV1RNAQUANT <20 (H) 11/23/2022   CD4TABS 395 (L) 11/23/2022   CD4TABS 303 (L) 03/14/2022   CD4TABS 401 11/22/2021    RPR and STI Lab Results  Component Value Date   LABRPR NON-REACTIVE 08/27/2023   LABRPR NON-REACTIVE 08/25/2021   LABRPR NON REAC 10/22/2012        No data to display          Hepatitis B Lab Results  Component Value Date   HEPBSAB NON-REACTIVE 08/27/2023   HEPBSAG REACTIVE (A) 08/27/2023   HEPBCAB POS (A) 10/22/2012    Hepatitis C No results found for: HEPCAB, HCVRNAPCRQN Hepatitis A Lab Results  Component Value Date   HAV NEG 10/22/2012   Lipids: Lab Results  Component Value Date   CHOL 134 08/25/2021   TRIG 70 08/25/2021   HDL 36 (L) 08/25/2021   CHOLHDL 3.7 08/25/2021   VLDL 18 10/22/2012   LDLCALC 83 08/25/2021    Current HIV Regimen: Biktarvy  + Sunlenca   -->Target date: The 19th of each month  Assessment: ***  -Sunlenca  injection -Getting Biktarvy  okay? -Tolerating Biktarvy ? -Last injections okay? -Vax: flu, Shingrix  -Schedule for March  Plan: ***  Izetta Carl, PharmD PGY1 Pharmacy Resident

## 2023-11-27 ENCOUNTER — Ambulatory Visit (INDEPENDENT_AMBULATORY_CARE_PROVIDER_SITE_OTHER): Payer: Self-pay | Admitting: Pharmacist

## 2023-11-27 ENCOUNTER — Other Ambulatory Visit: Payer: Self-pay

## 2023-11-27 DIAGNOSIS — B20 Human immunodeficiency virus [HIV] disease: Secondary | ICD-10-CM | POA: Diagnosis not present

## 2023-11-27 DIAGNOSIS — Z23 Encounter for immunization: Secondary | ICD-10-CM | POA: Diagnosis not present

## 2023-11-27 DIAGNOSIS — Z0184 Encounter for antibody response examination: Secondary | ICD-10-CM | POA: Diagnosis not present

## 2023-11-27 MED ORDER — LENACAPAVIR SODIUM 463.5 MG/1.5ML ~~LOC~~ SOLN
927.0000 mg | Freq: Once | SUBCUTANEOUS | Status: AC
  Administered 2023-11-27: 927 mg via SUBCUTANEOUS

## 2023-11-28 DIAGNOSIS — Z23 Encounter for immunization: Secondary | ICD-10-CM | POA: Diagnosis not present

## 2023-11-30 LAB — HEPATITIS A ANTIBODY, TOTAL: Hepatitis A AB,Total: REACTIVE — AB

## 2023-11-30 LAB — LIPID PANEL
Cholesterol: 138 mg/dL (ref <200)
HDL: 37 mg/dL — ABNORMAL LOW (ref >=40)
LDL Cholesterol (Calc): 87 mg/dL
Non-HDL Cholesterol (Calc): 101 mg/dL (ref <130)
Total CHOL/HDL Ratio: 3.7 (calc) (ref <5.0)
Triglycerides: 56 mg/dL (ref <150)

## 2023-11-30 LAB — HIV-1 RNA QUANT-NO REFLEX-BLD
HIV 1 RNA Quant: NOT DETECTED {copies}/mL
HIV-1 RNA Quant, Log: NOT DETECTED {Log_copies}/mL

## 2024-05-26 ENCOUNTER — Ambulatory Visit: Admitting: Family
